# Patient Record
Sex: Female | Born: 1935 | Race: White | Hispanic: No | Marital: Married | Smoking: Former smoker
Health system: Southern US, Community
[De-identification: ages and names within clinical notes are randomized; demographics above are authoritative.]

## PROBLEM LIST (undated history)

## (undated) ENCOUNTER — Ambulatory Visit: Admission: EM | Payer: Medicare PPO

## (undated) DIAGNOSIS — J449 Chronic obstructive pulmonary disease, unspecified: Secondary | ICD-10-CM

## (undated) DIAGNOSIS — K219 Gastro-esophageal reflux disease without esophagitis: Secondary | ICD-10-CM

## (undated) DIAGNOSIS — E785 Hyperlipidemia, unspecified: Secondary | ICD-10-CM

## (undated) DIAGNOSIS — M8580 Other specified disorders of bone density and structure, unspecified site: Secondary | ICD-10-CM

## (undated) DIAGNOSIS — I10 Essential (primary) hypertension: Secondary | ICD-10-CM

## (undated) DIAGNOSIS — K635 Polyp of colon: Secondary | ICD-10-CM

## (undated) HISTORY — PX: EYE SURGERY: SHX253

## (undated) HISTORY — PX: COLONOSCOPY: SHX174

## (undated) HISTORY — DX: Chronic obstructive pulmonary disease, unspecified: J44.9

## (undated) HISTORY — DX: Hyperlipidemia, unspecified: E78.5

## (undated) HISTORY — DX: Gastro-esophageal reflux disease without esophagitis: K21.9

## (undated) HISTORY — DX: Polyp of colon: K63.5

## (undated) HISTORY — PX: DENTAL SURGERY: SHX609

## (undated) HISTORY — DX: Essential (primary) hypertension: I10

## (undated) HISTORY — DX: Other specified disorders of bone density and structure, unspecified site: M85.80

---

## 2005-02-21 ENCOUNTER — Ambulatory Visit: Payer: Self-pay | Admitting: Family Medicine

## 2005-06-06 ENCOUNTER — Ambulatory Visit: Payer: Self-pay | Admitting: Gastroenterology

## 2006-03-20 ENCOUNTER — Encounter: Payer: Self-pay | Admitting: Internal Medicine

## 2006-04-12 ENCOUNTER — Ambulatory Visit: Payer: Self-pay | Admitting: Family Medicine

## 2006-04-14 ENCOUNTER — Encounter: Payer: Self-pay | Admitting: Internal Medicine

## 2006-09-05 ENCOUNTER — Encounter: Payer: Self-pay | Admitting: Internal Medicine

## 2007-04-16 ENCOUNTER — Ambulatory Visit: Payer: Self-pay | Admitting: Family Medicine

## 2008-05-22 ENCOUNTER — Ambulatory Visit: Payer: Self-pay | Admitting: Internal Medicine

## 2008-05-22 DIAGNOSIS — J441 Chronic obstructive pulmonary disease with (acute) exacerbation: Secondary | ICD-10-CM

## 2008-05-22 DIAGNOSIS — Z860100 Personal history of colon polyps, unspecified: Secondary | ICD-10-CM | POA: Insufficient documentation

## 2008-05-22 DIAGNOSIS — I10 Essential (primary) hypertension: Secondary | ICD-10-CM | POA: Insufficient documentation

## 2008-05-22 DIAGNOSIS — R7309 Other abnormal glucose: Secondary | ICD-10-CM

## 2008-05-22 DIAGNOSIS — K219 Gastro-esophageal reflux disease without esophagitis: Secondary | ICD-10-CM

## 2008-05-22 DIAGNOSIS — D649 Anemia, unspecified: Secondary | ICD-10-CM

## 2008-05-22 DIAGNOSIS — J449 Chronic obstructive pulmonary disease, unspecified: Secondary | ICD-10-CM | POA: Insufficient documentation

## 2008-05-22 DIAGNOSIS — E785 Hyperlipidemia, unspecified: Secondary | ICD-10-CM | POA: Insufficient documentation

## 2008-06-10 ENCOUNTER — Encounter: Payer: Self-pay | Admitting: Internal Medicine

## 2008-06-10 ENCOUNTER — Ambulatory Visit: Payer: Self-pay | Admitting: Internal Medicine

## 2008-06-10 LAB — CONVERTED CEMR LAB: Pap Smear: NORMAL

## 2008-06-25 ENCOUNTER — Ambulatory Visit: Payer: Self-pay | Admitting: Internal Medicine

## 2008-06-25 DIAGNOSIS — M949 Disorder of cartilage, unspecified: Secondary | ICD-10-CM

## 2008-06-25 DIAGNOSIS — M899 Disorder of bone, unspecified: Secondary | ICD-10-CM | POA: Insufficient documentation

## 2008-06-25 LAB — CONVERTED CEMR LAB
ALT: 28 units/L (ref 0–35)
AST: 37 units/L (ref 0–37)
Albumin: 3.9 g/dL (ref 3.5–5.2)
Alkaline Phosphatase: 51 units/L (ref 39–117)
Basophils Relative: 0.5 % (ref 0.0–3.0)
Bilirubin Urine: NEGATIVE
CO2: 26 meq/L (ref 19–32)
Calcium: 9.2 mg/dL (ref 8.4–10.5)
Chloride: 108 meq/L (ref 96–112)
Cholesterol: 166 mg/dL (ref 0–200)
GFR calc non Af Amer: 65.62 mL/min (ref >60)
HCT: 36.4 % (ref 36.0–46.0)
Hemoglobin, Urine: NEGATIVE
Hemoglobin: 12.8 g/dL (ref 12.0–15.0)
Ketones, ur: NEGATIVE mg/dL
LDL Cholesterol: 81 mg/dL (ref 0–99)
LDL Goal: 130 mg/dL
Lymphocytes Relative: 24.7 % (ref 12.0–46.0)
Lymphs Abs: 1.3 10*3/uL (ref 0.7–4.0)
Monocytes Relative: 6.7 % (ref 3.0–12.0)
Neutro Abs: 3.4 10*3/uL (ref 1.4–7.7)
RBC: 3.97 M/uL (ref 3.87–5.11)
Total Protein, Urine: NEGATIVE mg/dL
Total Protein: 6.9 g/dL (ref 6.0–8.3)
Triglycerides: 62 mg/dL (ref 0.0–149.0)
Urine Glucose: NEGATIVE mg/dL

## 2008-06-26 ENCOUNTER — Encounter: Payer: Self-pay | Admitting: Internal Medicine

## 2008-10-07 ENCOUNTER — Ambulatory Visit: Payer: Self-pay | Admitting: Internal Medicine

## 2008-10-09 ENCOUNTER — Ambulatory Visit: Payer: Self-pay | Admitting: Internal Medicine

## 2008-10-09 ENCOUNTER — Telehealth: Payer: Self-pay | Admitting: Internal Medicine

## 2008-10-12 ENCOUNTER — Encounter: Payer: Self-pay | Admitting: Internal Medicine

## 2008-10-28 ENCOUNTER — Telehealth: Payer: Self-pay | Admitting: Internal Medicine

## 2008-12-07 ENCOUNTER — Telehealth: Payer: Self-pay | Admitting: Internal Medicine

## 2009-05-18 ENCOUNTER — Telehealth: Payer: Self-pay | Admitting: Internal Medicine

## 2009-06-08 ENCOUNTER — Telehealth: Payer: Self-pay | Admitting: Internal Medicine

## 2009-06-11 ENCOUNTER — Ambulatory Visit: Payer: Self-pay | Admitting: Internal Medicine

## 2009-06-11 ENCOUNTER — Encounter: Payer: Self-pay | Admitting: Internal Medicine

## 2009-08-17 ENCOUNTER — Telehealth: Payer: Self-pay | Admitting: Internal Medicine

## 2009-08-30 ENCOUNTER — Ambulatory Visit: Payer: Self-pay | Admitting: Family Medicine

## 2009-09-01 ENCOUNTER — Ambulatory Visit: Payer: Self-pay | Admitting: Family Medicine

## 2009-09-01 DIAGNOSIS — R5381 Other malaise: Secondary | ICD-10-CM | POA: Insufficient documentation

## 2009-09-01 DIAGNOSIS — R5383 Other fatigue: Secondary | ICD-10-CM

## 2009-09-02 LAB — CONVERTED CEMR LAB
ALT: 25 units/L (ref 0–35)
AST: 27 units/L (ref 0–37)
Albumin: 4.1 g/dL (ref 3.5–5.2)
Basophils Absolute: 0 10*3/uL (ref 0.0–0.1)
Chloride: 106 meq/L (ref 96–112)
Eosinophils Relative: 3 % (ref 0.0–5.0)
GFR calc non Af Amer: 51.34 mL/min (ref >60)
Glucose, Bld: 115 mg/dL — ABNORMAL HIGH (ref 70–99)
HCT: 36.9 % (ref 36.0–46.0)
Hemoglobin: 12.6 g/dL (ref 12.0–15.0)
Lymphs Abs: 1.4 10*3/uL (ref 0.7–4.0)
Monocytes Relative: 7.2 % (ref 3.0–12.0)
Neutro Abs: 4.1 10*3/uL (ref 1.4–7.7)
Potassium: 4.8 meq/L (ref 3.5–5.1)
RDW: 14.8 % — ABNORMAL HIGH (ref 11.5–14.6)
Sodium: 142 meq/L (ref 135–145)
TSH: 2.08 microintl units/mL (ref 0.35–5.50)
Vit D, 25-Hydroxy: 53 ng/mL (ref 30–89)

## 2010-02-10 ENCOUNTER — Encounter (INDEPENDENT_AMBULATORY_CARE_PROVIDER_SITE_OTHER): Payer: Self-pay | Admitting: *Deleted

## 2010-04-12 NOTE — Progress Notes (Signed)
Summary: Referral  Phone Note Call from Patient Call back at Home Phone [phone]   Caller: Patient Summary of Call: pt left message on triage requesting a referral for annual mammogram @ [facility] Initial call taken by: Sydell Axon,  May 18, 2009 4:26 PM  Follow-up for Phone Call        done Follow-up by: Etta Grandchild MD,  May 18, 2009 6:40 PM

## 2010-04-12 NOTE — Miscellaneous (Signed)
Summary: med list update  Clinical Lists Changes  Medications: Removed medication of SPIRIVA HANDIHALER 18 MCG CAPS (TIOTROPIUM BROMIDE MONOHYDRATE)     Prior Medications: EVISTA 60 MG TABS (RALOXIFENE HCL) Take 1 tablet by mouth once a day LUMIGAN 0.03 % SOLN (BIMATOPROST)  CADUET 10-20 MG TABS (AMLODIPINE-ATORVASTATIN) one  day DIOVAN HCT 160-12.5 MG TABS (VALSARTAN-HYDROCHLOROTHIAZIDE) one a day VITAMIN D 1000 UNIT CAPS (CHOLECALCIFEROL) one by mouth bid CALCIUM-CARB 600 600 MG TABS (CALCIUM CARBONATE) One by mouth TID BENZONATATE 100 MG CAPS (BENZONATATE) as needed SPIRIVA HANDIHALER 18 MCG CAPS (TIOTROPIUM BROMIDE MONOHYDRATE) inhale contents of one capsule once daily Current Allergies: ! CODEINE

## 2010-04-12 NOTE — Progress Notes (Signed)
  Phone Note Refill Request Message from:  Fax from Pharmacy on June 08, 2009 8:39 AM  Refills Requested: Medication #1:  EVISTA 60 MG TABS Take 1 tablet by mouth once a day   Supply Requested: 1 month Initial call taken by: Rock Nephew CMA,  June 08, 2009 8:39 AM    Prescriptions: EVISTA 60 MG TABS (RALOXIFENE HCL) Take 1 tablet by mouth once a day  #30 x 6   Entered by:   Rock Nephew CMA   Authorized by:   Etta Grandchild MD   Signed by:   Rock Nephew CMA on 06/08/2009   Method used:   Electronically to        [facility]* (retail)       [address]       [place], [place]  [address]       Ph: [phone]       Fax: [phone]   RxID:   [id number]   Appended Document:    PAP Screening:    Last PAP smear:  06/10/2008  Mammogram Screening:    Last Mammogram:  06/11/2009  Mammogram Results:    Date of Exam:  06/11/2009    Results:  Normal Bilateral  Osteoporosis Risk Assessment:  Risk Factors for Fracture or Low Bone Density:   Race (White or Asian):     yes   Hx of Fractures:       no   FH of Osteoporosis:     no   Hx of falls:       no   Physically inactive:     no   Smoking status:       quit   High alcohol use:     no   Low calcium/Vit. D intake:   no   Corticosteroid use:     no   Heparin use:       no   Thyroid disease:     no   Rheumatoid Arthritis:     no  Immunization & Chemoprophylaxis:    Hepatitis B vaccine #1: Historical  (12/01/2005)    Hepatitis B vaccine #2: Historical  (01/01/2006)    Hepatitis B vaccine #3: Historical  (07/06/2006)    Hepatitis A vaccine #1: Historical  (06/26/1995)    Hepatitis A vaccine #2: Historical  (12/26/1995)    Tetanus vaccine: Historical  (12/01/2005)    Influenza vaccine: given  (03/14/2007)    Pneumovax: Historical  (12/01/2005)

## 2010-04-12 NOTE — Miscellaneous (Signed)
Summary: med list update- spiriva  Clinical Lists Changes  Medications: Added new medication of SPIRIVA HANDIHALER 18 MCG CAPS (TIOTROPIUM BROMIDE MONOHYDRATE) inhale contents of one capsule once daily     Prior Medications: EVISTA 60 MG TABS (RALOXIFENE HCL) Take 1 tablet by mouth once a day LUMIGAN 0.03 % SOLN (BIMATOPROST)  SPIRIVA HANDIHALER 18 MCG CAPS (TIOTROPIUM BROMIDE MONOHYDRATE)  CADUET 10-20 MG TABS (AMLODIPINE-ATORVASTATIN) one  day DIOVAN HCT 160-12.5 MG TABS (VALSARTAN-HYDROCHLOROTHIAZIDE) one a day VITAMIN D 1000 UNIT CAPS (CHOLECALCIFEROL) one by mouth bid CALCIUM-CARB 600 600 MG TABS (CALCIUM CARBONATE) One by mouth TID BENZONATATE 100 MG CAPS (BENZONATATE) as needed SPIRIVA HANDIHALER 18 MCG CAPS (TIOTROPIUM BROMIDE MONOHYDRATE) inhale contents of one capsule once daily Current Allergies: ! CODEINE

## 2010-04-12 NOTE — Assessment & Plan Note (Signed)
Summary: NEW PT TO EST/CLE   Vital Signs:  Patient profile:   75 year old female Height:      63 inches Weight:      171.6 pounds BMI:     30.51 Temp:     98.3 degrees F oral Pulse rate:   76 / minute Pulse rhythm:   regular BP sitting:   120 / 70  (left arm) Cuff size:   regular  Vitals Entered By: Benny Lennert CMA Duncan Dull) (August 30, 2009 9:08 AM)  History of Present Illness: Chief complaint new patient from [facility]  HTN: stable on current meds, no probs  HYPERLIPIDEMIA, on caduet, carries this diagnosis with no recent labs  COPD: PFT's reviewed with her - mild emphysema  GERD, stable       Allergies: 1)  ! Codeine  Past History:  Past medical, surgical, family and social histories (including risk factors) reviewed, and no changes noted (except as noted below).  Past Medical History: Reviewed history from 06/25/2008 and no changes required. Colonic polyps, hx of COPD GERD Hyperlipidemia Hypertension Osteopenia  Past Surgical History: Reviewed history from 05/22/2008 and no changes required. Denies surgical history  Family History: Reviewed history from 05/22/2008 and no changes required. Family History of Alcoholism/Addiction Family History of Arthritis Family History Diabetes 1st degree relative Family History High cholesterol Family History Hypertension Family History of Stroke F 1st degree relative <60  Social History: Reviewed history from 05/22/2008 and no changes required. Retired Married Alcohol use-yes Drug use-no Regular exercise-yes  Review of Systems       REVIEW OF SYSTEMS GEN: some nasal congestion now, no fever, chills, sweats. CV: No chest pain or SOB GI: No noted N or V Otherwise, pertinent positives and negatives are noted in the HPI.   Physical Exam  General:  GEN: WDWN, NAD, Non-toxic, A & O x 3 HEENT: Atraumatic, Normocephalic. Neck supple. No masses, No LAD. Ears and Nose: No external deformity. CV: RRR, No  M/G/R. No JVD. No thrill. No extra heart sounds. PULM: CTA B, no wheezes, crackles, rhonchi. No retractions. No resp. distress. No accessory muscle use. EXTR: No c/c/e NEURO: Normal gait.  PSYCH: Normally interactive. Conversant. Not depressed or anxious appearing.  Calm demeanor.     Impression & Recommendations:  Problem # 1:  HYPERTENSION (ICD-401.9) Assessment Unchanged  Her updated medication list for this problem includes:    Caduet 10-20 Mg Tabs (Amlodipine-atorvastatin) ..... One  day    Diovan Hct 160-12.5 Mg Tabs (Valsartan-hydrochlorothiazide) ..... One a day  BP today: 120/70 Prior BP: 120/72 (10/07/2008)  Prior 10 Yr Risk Heart Disease: 5 % (10/07/2008)  Labs Reviewed: K+: 4.5 (06/25/2008) Creat: : 0.9 (06/25/2008)   Chol: 166 (06/25/2008)   HDL: 72.40 (06/25/2008)   LDL: 81 (06/25/2008)   TG: 62.0 (06/25/2008)  Orders: Prescription Created Electronically [id number])  Problem # 2:  HYPERLIPIDEMIA (ICD-272.4) Assessment: Unchanged  Her updated medication list for this problem includes:    Caduet 10-20 Mg Tabs (Amlodipine-atorvastatin) ..... One  day  Labs Reviewed: SGOT: 37 (06/25/2008)   SGPT: 28 (06/25/2008)  Lipid Goals: Chol Goal: 200 (06/25/2008)   HDL Goal: 40 (06/25/2008)   LDL Goal: 130 (06/25/2008)   TG Goal: 150 (06/25/2008)  Prior 10 Yr Risk Heart Disease: 5 % (10/07/2008)   HDL:72.40 (06/25/2008)  LDL:81 (06/25/2008)  Chol:166 (06/25/2008)  Trig:62.0 (06/25/2008)  Problem # 3:  COPD (ICD-496) Assessment: Unchanged  The following medications were removed from the medication list:  Singulair 10 Mg Tabs (Montelukast sodium) Her updated medication list for this problem includes:    Spiriva Handihaler 18 Mcg Caps (Tiotropium bromide monohydrate)  Problem # 4:  GERD (ICD-530.81)  Complete Medication List: 1)  Evista 60 Mg Tabs (Raloxifene hcl) .... Take 1 tablet by mouth once a day 2)  Lumigan 0.03 % Soln (Bimatoprost) 3)  Spiriva Handihaler 18  Mcg Caps (Tiotropium bromide monohydrate) 4)  Caduet 10-20 Mg Tabs (Amlodipine-atorvastatin) .... One  day 5)  Diovan Hct 160-12.5 Mg Tabs (Valsartan-hydrochlorothiazide) .... One a day 6)  Vitamin D 1000 Unit Caps (Cholecalciferol) .... One by mouth bid 7)  Calcium-carb 600 600 Mg Tabs (Calcium carbonate) .... One by mouth tid 8)  Benzonatate 100 Mg Caps (Benzonatate) .... As needed  Patient Instructions: 1)  Please make a future lab appointment: Stop at the front desk on your way out to set it up. You can come in that day directly and will not have to see the doctor. 2)  future fasting lipid panel 3)  272.4 4)  BMP: v77.1 5)  Hepatic Function Panel: v58.69 6)  CBC with diff: 780.79 7)  TSH: 780.79  8)  Vit D: 780.79 Prescriptions: EVISTA 60 MG TABS (RALOXIFENE HCL) Take 1 tablet by mouth once a day  #30 x 11   Entered and Authorized by:   Hannah Beat MD   Signed by:   Hannah Beat MD on 08/30/2009   Method used:   Electronically to        [facility]* (retail)       [address]       [place], [place]  [address]       Ph: [phone]       Fax: [phone]   RxID:   [id number] DIOVAN HCT 160-12.5 MG TABS (VALSARTAN-HYDROCHLOROTHIAZIDE) one a day  #30 x 11   Entered and Authorized by:   Hannah Beat MD   Signed by:   Hannah Beat MD on 08/30/2009   Method used:   Electronically to        [facility]* (retail)       [address]       [place], [place]  [address]       Ph: [phone]       Fax: [phone]   RxID:   [id number]   Current Allergies (reviewed today): ! CODEINE

## 2010-04-12 NOTE — Progress Notes (Signed)
  Phone Note Refill Request Message from:  Fax from Pharmacy on August 17, 2009 10:40 AM  Refills Requested: Medication #1:  DIOVAN HCT 160-12.5 MG TABS one a day Initial call taken by: Rock Nephew CMA,  August 17, 2009 10:40 AM    Prescriptions: DIOVAN HCT 160-12.5 MG TABS (VALSARTAN-HYDROCHLOROTHIAZIDE) one a day  #30 x 4   Entered by:   Rock Nephew CMA   Authorized by:   Etta Grandchild MD   Signed by:   Rock Nephew CMA on 08/17/2009   Method used:   Electronically to        [facility]* (retail)       [address]       [place], [place]  [address]       Ph: [phone]       Fax: [phone]   RxID:   [id number]

## 2010-05-24 NOTE — Letter (Signed)
Summary: Rebecca Hamilton NEW PATIENT QUESTIONAIRE  [place] NEW PATIENT QUESTIONAIRE   Imported By: Carin Primrose 05/16/2010 17:09:24  _____________________________________________________________________  External Attachment:    Type:   Image     Comment:   External Document

## 2010-06-14 ENCOUNTER — Ambulatory Visit: Payer: Self-pay | Admitting: Family Medicine

## 2010-09-05 ENCOUNTER — Other Ambulatory Visit: Payer: Self-pay | Admitting: *Deleted

## 2010-09-05 MED ORDER — VALSARTAN-HYDROCHLOROTHIAZIDE 160-12.5 MG PO TABS: 1.0000 | ORAL_TABLET | Freq: Every day | ORAL | Status: DC

## 2010-09-05 NOTE — Telephone Encounter (Signed)
Add to medication list checked in EMR to verify and it was on their medication list

## 2010-09-06 MED ORDER — RALOXIFENE HCL 60 MG PO TABS: 60.0000 mg | ORAL_TABLET | Freq: Every day | ORAL | Status: DC

## 2010-09-06 NOTE — Telephone Encounter (Signed)
Patient already had physical appointment for the end of july

## 2010-09-06 NOTE — Telephone Encounter (Signed)
Ok to refill #30, 1 refill  Arrange annual CPX, 30 mins, in next 1-2 months

## 2010-10-05 ENCOUNTER — Other Ambulatory Visit (INDEPENDENT_AMBULATORY_CARE_PROVIDER_SITE_OTHER): Payer: Medicare Other

## 2010-10-05 DIAGNOSIS — E559 Vitamin D deficiency, unspecified: Secondary | ICD-10-CM

## 2010-10-05 DIAGNOSIS — R5383 Other fatigue: Secondary | ICD-10-CM

## 2010-10-05 DIAGNOSIS — Z13220 Encounter for screening for lipoid disorders: Secondary | ICD-10-CM

## 2010-10-05 DIAGNOSIS — R5381 Other malaise: Secondary | ICD-10-CM

## 2010-10-05 DIAGNOSIS — Z79899 Other long term (current) drug therapy: Secondary | ICD-10-CM

## 2010-10-05 LAB — BASIC METABOLIC PANEL
BUN: 14 mg/dL (ref 6–23)
Calcium: 9.1 mg/dL (ref 8.4–10.5)
GFR: 68.71 mL/min (ref >60.00)
Glucose, Bld: 99 mg/dL (ref 70–99)

## 2010-10-05 LAB — CBC WITH DIFFERENTIAL/PLATELET
Basophils Relative: 0.8 % (ref 0.0–3.0)
Eosinophils Relative: 5.9 % — ABNORMAL HIGH (ref 0.0–5.0)
HCT: 38.1 % (ref 36.0–46.0)
Lymphs Abs: 1.4 10*3/uL (ref 0.7–4.0)
Monocytes Relative: 7.3 % (ref 3.0–12.0)
Neutrophils Relative %: 61.2 % (ref 43.0–77.0)
Platelets: 216 10*3/uL (ref 150.0–400.0)
RBC: 4.06 Mil/uL (ref 3.87–5.11)
WBC: 5.6 10*3/uL (ref 4.5–10.5)

## 2010-10-05 LAB — LIPID PANEL
HDL: 69.4 mg/dL (ref >39.00)
Total CHOL/HDL Ratio: 2

## 2010-10-05 LAB — HEPATIC FUNCTION PANEL
ALT: 26 U/L (ref 0–35)
AST: 29 U/L (ref 0–37)
Bilirubin, Direct: 0 mg/dL (ref 0.0–0.3)
Total Bilirubin: 0.5 mg/dL (ref 0.3–1.2)
Total Protein: 6.8 g/dL (ref 6.0–8.3)

## 2010-10-07 ENCOUNTER — Encounter: Payer: Self-pay | Admitting: Family Medicine

## 2010-10-10 ENCOUNTER — Ambulatory Visit (INDEPENDENT_AMBULATORY_CARE_PROVIDER_SITE_OTHER): Payer: Medicare Other | Admitting: Family Medicine

## 2010-10-10 ENCOUNTER — Encounter: Payer: Self-pay | Admitting: Family Medicine

## 2010-10-10 ENCOUNTER — Other Ambulatory Visit (HOSPITAL_COMMUNITY)
Admission: RE | Admit: 2010-10-10 | Discharge: 2010-10-10 | Disposition: A | Discharge status: Home / Self Care | Payer: Medicare Other | Source: Ambulatory Visit | Attending: Family Medicine | Admitting: Family Medicine

## 2010-10-10 VITALS — BP 120/72 | HR 77 | Temp 98.1°F | Ht 62.0 in | Wt 167.4 lb

## 2010-10-10 DIAGNOSIS — Z0000 Encounter for general adult medical examination without abnormal findings: Secondary | ICD-10-CM

## 2010-10-10 DIAGNOSIS — Z87898 Personal history of other specified conditions: Secondary | ICD-10-CM

## 2010-10-10 DIAGNOSIS — Z01419 Encounter for gynecological examination (general) (routine) without abnormal findings: Secondary | ICD-10-CM

## 2010-10-10 DIAGNOSIS — E785 Hyperlipidemia, unspecified: Secondary | ICD-10-CM

## 2010-10-10 DIAGNOSIS — Z0100 Encounter for examination of eyes and vision without abnormal findings: Secondary | ICD-10-CM

## 2010-10-10 DIAGNOSIS — I10 Essential (primary) hypertension: Secondary | ICD-10-CM

## 2010-10-10 DIAGNOSIS — Z124 Encounter for screening for malignant neoplasm of cervix: Secondary | ICD-10-CM

## 2010-10-10 DIAGNOSIS — M899 Disorder of bone, unspecified: Secondary | ICD-10-CM

## 2010-10-10 NOTE — Progress Notes (Signed)
Rebecca Hamilton, a 75 y.o. female presents today in the office for the following:    Medicare CPX, f/u medical issues, as well as breast exam and pap smear:  Pap: h/o abnormal pap smears in the past 20 years ago had some abnormal pap smears Last was 5 years ago  Paps, every 3 years needed  HTN: Tolerating all medications without side effects Stable and at goal No CP, no sob. No HA.  BP Readings from Last 3 Encounters:  10/10/10 120/72  08/30/09 120/70  10/07/08 120/72    Basic Metabolic Panel:    Component Value Date/Time   NA 142 10/05/2010 0930   K 4.8 10/05/2010 0930   CL 108 10/05/2010 0930   CO2 27 10/05/2010 0930   BUN 14 10/05/2010 0930   CREATININE 0.9 10/05/2010 0930   GLUCOSE 99 10/05/2010 0930   CALCIUM 9.1 10/05/2010 0930   Lipids: Doing well, stable. Tolerating meds fine with no SE. Panel reviewed with patient.  Lipids:    Component Value Date/Time   CHOL 158 10/05/2010 0930   TRIG 92.0 10/05/2010 0930   HDL 69.40 10/05/2010 0930   VLDL 18.4 10/05/2010 0930   CHOLHDL 2 10/05/2010 0930    Lab Results  Component Value Date   ALT 26 10/05/2010   AST 29 10/05/2010   ALKPHOS 50 10/05/2010   BILITOT 0.5 10/05/2010     Health Maintenance Summary Reviewed and updated, unless pt declines services.  Tobacco History Reviewed. Non-smoker Alcohol: No concerns, no excessive use Exercise Habits: Some activity, rec at least 30 mins 5 times a week - working out now Menses regular: n/a Lumps or breast concerns: no   Health Maintenance  Topic Date Due  . Influenza Vaccine  12/12/2010  . Tetanus/tdap  12/02/2015  . Colonoscopy  05/23/2018  . Pneumococcal Polysaccharide Vaccine Age 75 And Over  Completed  . Zostavax  Completed    Labs reviewed with the patient.   Lipids:    Component Value Date/Time   CHOL 158 10/05/2010 0930   TRIG 92.0 10/05/2010 0930   HDL 69.40 10/05/2010 0930   VLDL 18.4 10/05/2010 0930   CHOLHDL 2 10/05/2010 0930    CBC:    Component Value  Date/Time   WBC 5.6 10/05/2010 0930   HGB 12.8 10/05/2010 0930   HCT 38.1 10/05/2010 0930   PLT 216.0 10/05/2010 0930   MCV 93.8 10/05/2010 0930   NEUTROABS 3.4 10/05/2010 0930   LYMPHSABS 1.4 10/05/2010 0930   MONOABS 0.4 10/05/2010 0930   EOSABS 0.3 10/05/2010 0930   BASOSABS 0.0 10/05/2010 0930    Basic Metabolic Panel:    Component Value Date/Time   NA 142 10/05/2010 0930   K 4.8 10/05/2010 0930   CL 108 10/05/2010 0930   CO2 27 10/05/2010 0930   BUN 14 10/05/2010 0930   CREATININE 0.9 10/05/2010 0930   GLUCOSE 99 10/05/2010 0930   CALCIUM 9.1 10/05/2010 0930    Lab Results  Component Value Date   ALT 26 10/05/2010   AST 29 10/05/2010   ALKPHOS 50 10/05/2010   BILITOT 0.5 10/05/2010   Osteopenia, has been on some form of female hormones then bisphosphonates since the 1980's.  I have personally reviewed the Medicare Annual Wellness questionnaire and have noted 1. The patient's medical and social history 2. Their use of alcohol, tobacco or illicit drugs 3. Their current medications and supplements 4. The patient's functional ability including ADL's, fall risks, home safety risks and hearing or visual  impairment. 5. Diet and physical activities 6. Evidence for depression or mood disorders  The patients weight, height, BMI and visual acuity have been recorded in the chart I have made referrals, counseling and provided education to the patient based review of the above and I have provided the pt with a written personalized care plan for preventive services.  I have provided the patient with a copy of your personalized plan for preventive services. Instructed to take the time to review along with their updated medication list.    Patient Active Problem List  Diagnoses  . HYPERLIPIDEMIA  . UNSPECIFIED ANEMIA  . HYPERTENSION  . COPD  . GERD  . OSTEOPENIA  . FATIGUE  . COLONIC POLYPS, HX OF  . Routine general medical examination at a health care facility   Past Medical  History  Diagnosis Date  . COPD (chronic obstructive pulmonary disease)   . GERD (gastroesophageal reflux disease)   . Osteopenia   . HTN (hypertension)   . Hyperlipidemia   . Colon polyps    No past surgical history on file. History  Substance Use Topics  . Smoking status: Never Smoker   . Smokeless tobacco: Not on file  . Alcohol Use: Yes   No family history on file. Allergies  Allergen Reactions  . Codeine    Current Outpatient Prescriptions on File Prior to Visit  Medication Sig Dispense Refill  . amlodipine-atorvastatin (CADUET) 10-20 MG per tablet Take 1 tablet by mouth daily.        . benzonatate (TESSALON) 100 MG capsule Take 100 mg by mouth as needed.        . bimatoprost (LUMIGAN) 0.03 % ophthalmic solution 1 drop at bedtime.        . calcium carbonate (OS-CAL) 600 MG TABS Take 600 mg by mouth 3 (three) times daily with meals.        . cholecalciferol (VITAMIN D) 1000 UNITS tablet Take 1,000 Units by mouth 3 (three) times daily.        . raloxifene (EVISTA) 60 MG tablet Take 1 tablet (60 mg total) by mouth daily.  30 tablet  1  . tiotropium (SPIRIVA HANDIHALER) 18 MCG inhalation capsule Place 18 mcg into inhaler and inhale daily.        . valsartan-hydrochlorothiazide (DIOVAN-HCT) 160-12.5 MG per tablet Take 1 tablet by mouth daily.  30 tablet  0   General: Denies fever, chills, sweats. No significant weight loss. Eyes: Denies blurring,significant itching ENT: Denies earache, sore throat, and hoarseness.  Cardiovascular: Denies chest pains  Respiratory: Denies cough, dyspnea at rest,wheeezing Breast: no concerns about lumps - some longstanding fibrocystic changes GI: Denies nausea, vomiting, diarrhea, BRBPR GU: Denies dysuria, discharge Musculoskeletal: Denies back pain, joint pain significantly Derm: Denies rash Neuro: Denies  Any significant problems Psych: Denies depression Endocrine: Denies symptoms, increased urinary outflow Heme: Denies enlarged lymph  nodes Allergy: No hayfever   Physical Exam  Blood pressure 120/72, pulse 77, temperature 98.1 F (36.7 C), temperature source Oral, height 5\' 2"  (1.575 m), weight 167 lb 6.4 oz (75.932 kg), SpO2 98.00%.  GEN: well developed, well nourished, no acute distress Eyes: conjunctiva and lids normal, PERRLA, EOMI ENT: TM clear, nares clear, oral exam WNL Neck: supple, no lymphadenopathy, no thyromegaly, no JVD Pulm: clear to auscultation and percussion, respiratory effort normal CV: regular rate and rhythm, S1-S2, no murmur, rub or gallop, no bruits, peripheral pulses normal and symmetric, no cyanosis, clubbing, edema  Chest: no scars, masses, no gynecomastia  BREAST: no lumps, no axillary LAD, no nipple discharge, some fibrocystic changes GI: soft, non-tender; no hepatosplenomegaly, masses; active bowel sounds all quadrants GU: Normal external female genitalia. Cervix appears intact without lesions or irritation. Vaginal canal normal without ulceration or lesion. Cervix NT to exam.  Lymph: no cervical, axillary or inguinal adenopathy MSK: gait normal, muscle tone and strength WNL, no joint swelling, effusions, discoloration, crepitus  SKIN: clear, good turgor, color WNL, no rashes, lesions, or ulcerations Neuro: normal mental status, normal strength, sensation, and motion Psych: alert; oriented to person, place and time, normally interactive and not anxious or depressed in appearance.  Assessment and Plan: 1.  Routine general medical examination at a health care facility The patient's preventative maintenance and recommended screening tests for an annual wellness exam were reviewed in full today. Brought up to date unless services declined.  Counselled on the importance of diet, exercise, and its role in overall health and mortality. The patient's FH and SH was reviewed, including their home life, tobacco status, and drug and alcohol status.  Medicare wellness completed  History of  abnormal Pap smear Breast exam and pap done today  HYPERLIPIDEMIA Stable, continue caduet  HYPERTENSION Stable, cont caduet and diovan-hct  OSTEOPENIA Check DEXA scan. If normal, d/c bisphosphonate and ca and vit D only

## 2010-10-10 NOTE — Patient Instructions (Signed)
REFERRAL: GO THE THE FRONT ROOM AT THE ENTRANCE OF OUR CLINIC, NEAR CHECK IN. ASK FOR MARION. SHE WILL HELP YOU SET UP YOUR REFERRAL. DATE: TIME:  

## 2010-10-11 ENCOUNTER — Encounter: Payer: Self-pay | Admitting: Family Medicine

## 2010-10-11 DIAGNOSIS — Z87898 Personal history of other specified conditions: Secondary | ICD-10-CM | POA: Insufficient documentation

## 2010-10-11 DIAGNOSIS — Z0000 Encounter for general adult medical examination without abnormal findings: Secondary | ICD-10-CM | POA: Insufficient documentation

## 2010-10-11 NOTE — Assessment & Plan Note (Signed)
The patient's preventative maintenance and recommended screening tests for an annual wellness exam were reviewed in full today. Brought up to date unless services declined.  Counselled on the importance of diet, exercise, and its role in overall health and mortality. The patient's FH and SH was reviewed, including their home life, tobacco status, and drug and alcohol status.  Medicare wellness completed

## 2010-10-11 NOTE — Assessment & Plan Note (Signed)
Stable, cont caduet and diovan-hct

## 2010-10-11 NOTE — Assessment & Plan Note (Signed)
Stable, continue caduet

## 2010-10-11 NOTE — Assessment & Plan Note (Signed)
Breast exam and pap done today

## 2010-10-11 NOTE — Assessment & Plan Note (Signed)
Check DEXA scan. If normal, d/c bisphosphonate and ca and vit D only

## 2010-10-14 ENCOUNTER — Encounter: Payer: Self-pay | Admitting: *Deleted

## 2010-10-19 ENCOUNTER — Other Ambulatory Visit: Payer: Self-pay | Admitting: *Deleted

## 2010-10-19 MED ORDER — VALSARTAN-HYDROCHLOROTHIAZIDE 160-12.5 MG PO TABS: 1.0000 | ORAL_TABLET | Freq: Every day | ORAL | Status: DC

## 2010-10-21 ENCOUNTER — Other Ambulatory Visit: Payer: Self-pay | Admitting: *Deleted

## 2010-10-21 MED ORDER — RALOXIFENE HCL 60 MG PO TABS: 60.0000 mg | ORAL_TABLET | Freq: Every day | ORAL | Status: DC

## 2010-11-08 ENCOUNTER — Other Ambulatory Visit: Payer: Self-pay | Admitting: *Deleted

## 2010-11-08 ENCOUNTER — Ambulatory Visit: Payer: Self-pay | Admitting: Family Medicine

## 2010-11-08 MED ORDER — AMLODIPINE-ATORVASTATIN 10-20 MG PO TABS: 1.0000 | ORAL_TABLET | Freq: Every day | ORAL | Status: DC

## 2010-11-09 ENCOUNTER — Encounter: Payer: Self-pay | Admitting: Family Medicine

## 2011-02-24 ENCOUNTER — Other Ambulatory Visit: Payer: Self-pay | Admitting: *Deleted

## 2011-02-24 MED ORDER — VALSARTAN-HYDROCHLOROTHIAZIDE 160-12.5 MG PO TABS: 1.0000 | ORAL_TABLET | Freq: Every day | ORAL | Status: DC

## 2011-03-13 ENCOUNTER — Other Ambulatory Visit: Payer: Self-pay | Admitting: *Deleted

## 2011-03-13 MED ORDER — TIOTROPIUM BROMIDE MONOHYDRATE 18 MCG IN CAPS: 18.0000 ug | ORAL_CAPSULE | Freq: Every day | RESPIRATORY_TRACT | Status: DC

## 2011-07-07 ENCOUNTER — Ambulatory Visit: Payer: Self-pay | Admitting: Family Medicine

## 2011-07-07 ENCOUNTER — Encounter: Payer: Self-pay | Admitting: Family Medicine

## 2011-07-10 ENCOUNTER — Encounter: Payer: Self-pay | Admitting: *Deleted

## 2011-09-11 ENCOUNTER — Other Ambulatory Visit: Payer: Medicare Other

## 2011-09-18 ENCOUNTER — Encounter: Payer: Medicare Other | Admitting: Family Medicine

## 2011-10-27 ENCOUNTER — Other Ambulatory Visit: Payer: Self-pay | Admitting: *Deleted

## 2011-10-27 ENCOUNTER — Other Ambulatory Visit: Payer: Self-pay | Admitting: Family Medicine

## 2011-10-27 DIAGNOSIS — R5381 Other malaise: Secondary | ICD-10-CM

## 2011-10-27 DIAGNOSIS — D649 Anemia, unspecified: Secondary | ICD-10-CM

## 2011-10-27 DIAGNOSIS — E785 Hyperlipidemia, unspecified: Secondary | ICD-10-CM

## 2011-10-27 MED ORDER — VALSARTAN-HYDROCHLOROTHIAZIDE 160-12.5 MG PO TABS: 1.0000 | ORAL_TABLET | Freq: Every day | ORAL | Status: DC

## 2011-11-01 ENCOUNTER — Other Ambulatory Visit (INDEPENDENT_AMBULATORY_CARE_PROVIDER_SITE_OTHER): Payer: Medicare Other

## 2011-11-01 DIAGNOSIS — E785 Hyperlipidemia, unspecified: Secondary | ICD-10-CM

## 2011-11-01 DIAGNOSIS — R5381 Other malaise: Secondary | ICD-10-CM

## 2011-11-01 DIAGNOSIS — D649 Anemia, unspecified: Secondary | ICD-10-CM

## 2011-11-01 LAB — CBC WITH DIFFERENTIAL/PLATELET
Basophils Relative: 0.7 % (ref 0.0–3.0)
Eosinophils Relative: 3.5 % (ref 0.0–5.0)
HCT: 43.7 % (ref 36.0–46.0)
Hemoglobin: 14.3 g/dL (ref 12.0–15.0)
Lymphocytes Relative: 25.4 % (ref 12.0–46.0)
Lymphs Abs: 1.5 10*3/uL (ref 0.7–4.0)
Monocytes Relative: 8.8 % (ref 3.0–12.0)
Neutro Abs: 3.7 10*3/uL (ref 1.4–7.7)
RBC: 4.58 Mil/uL (ref 3.87–5.11)
RDW: 13.9 % (ref 11.5–14.6)
WBC: 6 10*3/uL (ref 4.5–10.5)

## 2011-11-01 LAB — BASIC METABOLIC PANEL
CO2: 29 mEq/L (ref 19–32)
Calcium: 10.3 mg/dL (ref 8.4–10.5)
Creatinine, Ser: 0.8 mg/dL (ref 0.4–1.2)
GFR: 74.47 mL/min (ref >60.00)
Sodium: 139 mEq/L (ref 135–145)

## 2011-11-01 LAB — LIPID PANEL
LDL Cholesterol: 77 mg/dL (ref 0–99)
Total CHOL/HDL Ratio: 2
Triglycerides: 75 mg/dL (ref 0.0–149.0)

## 2011-11-01 LAB — HEPATIC FUNCTION PANEL
ALT: 28 U/L (ref 0–35)
AST: 28 U/L (ref 0–37)
Albumin: 4.1 g/dL (ref 3.5–5.2)
Alkaline Phosphatase: 53 U/L (ref 39–117)

## 2011-11-08 ENCOUNTER — Ambulatory Visit (INDEPENDENT_AMBULATORY_CARE_PROVIDER_SITE_OTHER): Payer: Medicare Other | Admitting: Family Medicine

## 2011-11-08 ENCOUNTER — Encounter: Payer: Self-pay | Admitting: Family Medicine

## 2011-11-08 VITALS — BP 120/70 | HR 72 | Temp 97.8°F | Ht 62.5 in | Wt 166.5 lb

## 2011-11-08 DIAGNOSIS — Z0000 Encounter for general adult medical examination without abnormal findings: Secondary | ICD-10-CM

## 2011-11-08 NOTE — Progress Notes (Signed)
Nature conservation officer at [facility] [address] [place] [address] Phone: [phone] Fax: [phone]  Date:  11/08/2011   Name:  Rebecca Hamilton   DOB:  [birth year]   MRN:  [id number] Gender: female Age: 76 y.o.  PCP:  Hannah Beat, MD    Chief Complaint: Annual Exam   History of Present Illness:  Rebecca Hamilton is a 76 y.o. pleasant patient who presents with the following:  Here for medicare wellness exam: generally, she is feeling well.  Health Maintenance Summary Reviewed and updated, unless pt declines services.  Tobacco History Reviewed. Non-smoker Alcohol: No concerns, no excessive use Exercise Habits: Some activity, rec at least 30 mins 5 times a week Lumps or breast concerns: no  Health Maintenance  Topic Date Due  . Influenza Vaccine  12/12/2011  . Mammogram  11/07/2012  . Tetanus/tdap  12/02/2015  . Colonoscopy  05/23/2018  . Pneumococcal Polysaccharide Vaccine Age 59 And Over  Completed  . Zostavax  Completed    Labs reviewed with the patient.  Results for orders placed in visit on 11/01/11  LIPID PANEL      Component Value Range   Cholesterol 170  0 - 200 mg/dL   Triglycerides 84.1  0.0 - 149.0 mg/dL   HDL 32.44  >01.02 mg/dL   VLDL 72.5  0.0 - 36.6 mg/dL   LDL Cholesterol 77  0 - 99 mg/dL   Total CHOL/HDL Ratio 2    CBC WITH DIFFERENTIAL      Component Value Range   WBC 6.0  4.5 - 10.5 K/uL   RBC 4.58  3.87 - 5.11 Mil/uL   Hemoglobin 14.3  12.0 - 15.0 g/dL   HCT 44.0  34.7 - 42.5 %   MCV 95.3  78.0 - 100.0 fl   MCHC 32.7  30.0 - 36.0 g/dL   RDW 95.6  38.7 - 56.4 %   Platelets 253.0  150.0 - 400.0 K/uL   Neutrophils Relative 61.6  43.0 - 77.0 %   Lymphocytes Relative 25.4  12.0 - 46.0 %   Monocytes Relative 8.8  3.0 - 12.0 %   Eosinophils Relative 3.5  0.0 - 5.0 %   Basophils Relative 0.7  0.0 - 3.0 %   Neutro Abs 3.7  1.4 - 7.7 K/uL   Lymphs Abs 1.5  0.7 - 4.0 K/uL   Monocytes Absolute 0.5  0.1 - 1.0 K/uL   Eosinophils Absolute  0.2  0.0 - 0.7 K/uL   Basophils Absolute 0.0  0.0 - 0.1 K/uL  HEPATIC FUNCTION PANEL      Component Value Range   Total Bilirubin 0.7  0.3 - 1.2 mg/dL   Bilirubin, Direct 0.1  0.0 - 0.3 mg/dL   Alkaline Phosphatase 53  39 - 117 U/L   AST 28  0 - 37 U/L   ALT 28  0 - 35 U/L   Total Protein 7.4  6.0 - 8.3 g/dL   Albumin 4.1  3.5 - 5.2 g/dL  BASIC METABOLIC PANEL      Component Value Range   Sodium 139  135 - 145 mEq/L   Potassium 5.0  3.5 - 5.1 mEq/L   Chloride 104  96 - 112 mEq/L   CO2 29  19 - 32 mEq/L   Glucose, Bld 104 (*) 70 - 99 mg/dL   BUN 17  6 - 23 mg/dL   Creatinine, Ser 0.8  0.4 - 1.2 mg/dL   Calcium 33.2  8.4 - 95.1  mg/dL   GFR 16.10  >96.04 mL/min  TSH      Component Value Range   TSH 2.24  0.35 - 5.50 uIU/mL     Patient Active Problem List  Diagnosis  . HYPERLIPIDEMIA  . UNSPECIFIED ANEMIA  . HYPERTENSION  . COPD  . GERD  . OSTEOPENIA  . FATIGUE  . COLONIC POLYPS, HX OF  . Routine general medical examination at a health care facility  . History of abnormal Pap smear    Past Medical History  Diagnosis Date  . COPD (chronic obstructive pulmonary disease)   . GERD (gastroesophageal reflux disease)   . Osteopenia   . HTN (hypertension)   . Hyperlipidemia   . Colon polyps     No past surgical history on file.  History  Substance Use Topics  . Smoking status: Former Games developer  . Smokeless tobacco: Never Used   Comment: quit 1988  . Alcohol Use: Yes     daily    No family history on file.  Allergies  Allergen Reactions  . Codeine     Medication list has been reviewed and updated.  Current Outpatient Prescriptions on File Prior to Visit  Medication Sig Dispense Refill  . aspirin 81 MG tablet Take 81 mg by mouth daily.        . bimatoprost (LUMIGAN) 0.03 % ophthalmic solution Place 1 drop into both eyes at bedtime.       . calcium carbonate (OS-CAL) 600 MG TABS Take 600 mg by mouth 2 (two) times daily with a meal.       . Thiamine HCl  (VITAMIN B-1) 50 MG tablet Take 50 mg by mouth daily.        Marland Kitchen tiotropium (SPIRIVA HANDIHALER) 18 MCG inhalation capsule Place 1 capsule (18 mcg total) into inhaler and inhale daily.  90 capsule  2  . valsartan-hydrochlorothiazide (DIOVAN-HCT) 160-12.5 MG per tablet Take 1 tablet by mouth daily.  30 tablet  0    Review of Systems:   General: Denies fever, chills, sweats. No significant weight loss. Eyes: Denies blurring,significant itching ENT: Denies earache, sore throat, and hoarseness.  Cardiovascular: Denies chest pains, palpitations, dyspnea on exertion,  Respiratory: Denies cough, dyspnea at rest,wheeezing Breast: no concerns about lumps GI: Denies nausea, vomiting, diarrhea, constipation, change in bowel habits, abdominal pain, melena, hematochezia GU: Denies dysuria, hematuria, urinary hesitancy, nocturia, denies STD risk, no concerns about discharge Musculoskeletal: Denies back pain, joint pain Derm: Denies rash, itching Neuro: Denies  paresthesias, frequent falls, frequent headaches Psych: Denies depression, anxiety Endocrine: Denies cold intolerance, heat intolerance, polydipsia Heme: Denies enlarged lymph nodes Allergy: No hayfever  Physical Examination: Filed Vitals:   11/08/11 0826  BP: 120/70  Pulse: 72  Temp: 97.8 F (36.6 C)   Filed Vitals:   11/08/11 0826  Height: 5' 2.5" (1.588 m)  Weight: 166 lb 8 oz (75.524 kg)   Body mass index is 29.97 kg/(m^2). Ideal Body Weight: Weight in (lb) to have BMI = 25: 138.6    GEN: well developed, well nourished, no acute distress Eyes: conjunctiva and lids normal, PERRLA, EOMI ENT: TM clear, nares clear, oral exam WNL Neck: supple, no lymphadenopathy, no thyromegaly, no JVD Pulm: clear to auscultation and percussion, respiratory effort normal CV: regular rate and rhythm, S1-S2, no murmur, rub or gallop, no bruits Chest: no scars, masses, no lumps BREAST: normal breast tissue without mass, no axillary LAD, nipple  tissue normal in texture.  GI: soft, non-tender; no hepatosplenomegaly, masses; active  bowel sounds all quadrants GU: GU exam declined Lymph: no cervical, axillary or inguinal adenopathy MSK: gait normal, muscle tone and strength WNL, no joint swelling, effusions, discoloration, crepitus  SKIN: clear, good turgor, color WNL, no rashes, lesions, or ulcerations Neuro: normal mental status, normal strength, sensation, and motion Psych: alert; oriented to person, place and time, normally interactive and not anxious or depressed in appearance.   Assessment and Plan: Medicare Wellness exam, doing very well.  I have personally reviewed the Medicare Annual Wellness questionnaire and have noted 1. The patient's medical and social history 2. Their use of alcohol, tobacco or illicit drugs 3. Their current medications and supplements 4. The patient's functional ability including ADL's, fall risks, home safety risks and hearing or visual             impairment. 5. Diet and physical activities 6. Evidence for depression or mood disorders  The patients weight, height, BMI and visual acuity have been recorded in the chart I have made referrals, counseling and provided education to the patient based review of the above and I have provided the pt with a written personalized care plan for preventive services.  I have provided the patient with a copy of your personalized plan for preventive services. Instructed to take the time to review along with their updated medication list.   Hannah Beat, MD

## 2011-11-09 MED ORDER — VALSARTAN-HYDROCHLOROTHIAZIDE 160-12.5 MG PO TABS: 1.0000 | ORAL_TABLET | Freq: Every day | ORAL | Status: DC

## 2011-11-22 ENCOUNTER — Telehealth: Payer: Self-pay | Admitting: *Deleted

## 2011-11-22 DIAGNOSIS — Z1211 Encounter for screening for malignant neoplasm of colon: Secondary | ICD-10-CM

## 2011-11-22 NOTE — Telephone Encounter (Signed)
I believe patient wanted [facility]

## 2011-11-22 NOTE — Telephone Encounter (Signed)
Spoke with patient and notified of 5 year recall on colonoscopy. She asked that we schedule her for that. I advised to await call from [facility]. Please place order in chart. Thanks!

## 2012-01-05 ENCOUNTER — Telehealth: Payer: Self-pay | Admitting: Family Medicine

## 2012-01-05 ENCOUNTER — Other Ambulatory Visit: Payer: Self-pay | Admitting: Family Medicine

## 2012-01-05 MED ORDER — AMLODIPINE-ATORVASTATIN 10-20 MG PO TABS: 1.0000 | ORAL_TABLET | Freq: Every day | ORAL | Status: DC

## 2012-01-05 NOTE — Telephone Encounter (Signed)
Pt prescribed Caduet and directions are written to take 1 tablet by mouth daily and also 1/2 by mouth daily.  Pharmacist not sure which the patient is supposed to take or if the patient should do both.

## 2012-01-06 NOTE — Telephone Encounter (Signed)
Ask the patient -   Is she taking a 1/2 tablet or 1 full tablet of Caduet? We have it written down both ways, clarify and refill.

## 2012-01-08 ENCOUNTER — Telehealth: Payer: Self-pay

## 2012-01-08 NOTE — Telephone Encounter (Signed)
Correction: Immunization done at 76 years old, no further pneumovax is needed.

## 2012-01-08 NOTE — Telephone Encounter (Signed)
Pt left v/m requesting pneumonia vaccine; pt had last pneumonia vaccine 12/01/2005; pt was 70 then, does pt need another pneumovax?Please advise.

## 2012-01-08 NOTE — Telephone Encounter (Signed)
Left message on machine to call back  

## 2012-01-08 NOTE — Telephone Encounter (Signed)
Spoke to patient and was advised that she takes 1/2 daily. Pharmacist notified as instructed by telephone.

## 2012-01-08 NOTE — Telephone Encounter (Signed)
Patient notified as instructed by telephone. 

## 2012-01-08 NOTE — Telephone Encounter (Signed)
Pneumovax is repeated 5 years after the initial vaccine.  yes

## 2012-03-19 ENCOUNTER — Other Ambulatory Visit: Payer: Self-pay | Admitting: *Deleted

## 2012-03-19 MED ORDER — TIOTROPIUM BROMIDE MONOHYDRATE 18 MCG IN CAPS: 18.0000 ug | ORAL_CAPSULE | Freq: Every day | RESPIRATORY_TRACT | Status: DC

## 2012-05-15 ENCOUNTER — Encounter: Payer: Self-pay | Admitting: Family Medicine

## 2012-05-15 ENCOUNTER — Ambulatory Visit (INDEPENDENT_AMBULATORY_CARE_PROVIDER_SITE_OTHER): Payer: Self-pay | Admitting: Family Medicine

## 2012-05-15 VITALS — BP 130/74 | HR 80 | Temp 98.5°F | Ht 62.5 in | Wt 173.2 lb

## 2012-05-15 MED ORDER — ALBUTEROL SULFATE HFA 108 (90 BASE) MCG/ACT IN AERS: 2.0000 | INHALATION_SPRAY | Freq: Four times a day (QID) | RESPIRATORY_TRACT | Status: DC | PRN

## 2012-05-15 MED ORDER — AZITHROMYCIN 250 MG PO TABS: ORAL_TABLET | ORAL | Status: DC

## 2012-05-15 MED ORDER — HYDROCODONE-HOMATROPINE 5-1.5 MG/5ML PO SYRP: ORAL_SOLUTION | ORAL | Status: DC

## 2012-05-15 NOTE — Progress Notes (Signed)
Nature conservation officer at [facility] [address] [place] [address] Phone: [phone] Fax: [phone]  Date:  05/15/2012   Name:  Rebecca Hamilton   DOB:  Jul 12, 1935   MRN:  [id number] Gender: female Age: 77 y.o.  Primary Physician:  Hannah Beat, MD  Evaluating MD: Hannah Beat, MD   Chief Complaint: Cough and Fatigue   History of Present Illness:  Rebecca Hamilton is a 77 y.o. pleasant patient who presents with the following:  Sore throat, husband gave strong cough syrup from her husband.  Got bad the last two days, and have been taking 5 days Was bringing things up She has been coughing fairly significantly, does have a history of COPD on Spiriva. She has been wheezing, notably at nighttime, but she takes it is is better somewhat today.  She has been afebrile. She is eating and drinking normally. No chills. No nausea, vomiting, or diarrhea.   Patient Active Problem List  Diagnosis  . HYPERLIPIDEMIA  . UNSPECIFIED ANEMIA  . HYPERTENSION  . COPD  . GERD  . OSTEOPENIA  . FATIGUE  . COLONIC POLYPS, HX OF  . History of abnormal Pap smear    Past Medical History  Diagnosis Date  . COPD (chronic obstructive pulmonary disease)   . GERD (gastroesophageal reflux disease)   . Osteopenia   . HTN (hypertension)   . Hyperlipidemia   . Colon polyps     History reviewed. No pertinent past surgical history.  History   Social History  . Marital Status: Married    Spouse Name: N/A    Number of Children: N/A  . Years of Education: N/A   Occupational History  . retired    Social History Main Topics  . Smoking status: Former Games developer  . Smokeless tobacco: Never Used     Comment: quit 1988  . Alcohol Use: Yes     Comment: daily  . Drug Use: No  . Sexually Active: Not on file   Other Topics Concern  . Not on file   Social History Narrative   Regular exercise--no    No family history on file.  Allergies  Allergen Reactions  . Codeine      Medication list has been reviewed and updated.  Outpatient Prescriptions Prior to Visit  Medication Sig Dispense Refill  . amlodipine-atorvastatin (CADUET) 10-20 MG per tablet Take 1/2 by mouth daily      . aspirin 81 MG tablet Take 81 mg by mouth daily.        . bimatoprost (LUMIGAN) 0.03 % ophthalmic solution Place 1 drop into both eyes at bedtime.       . calcium carbonate (OS-CAL) 600 MG TABS Take 600 mg by mouth 2 (two) times daily with a meal.       . Cholecalciferol (VITAMIN D) 2000 UNITS CAPS Take by mouth 2 (two) times daily.      . dorzolamide (TRUSOPT) 2 % ophthalmic solution Place 1 drop into the right eye 2 (two) times daily.      . Glucosamine-Chondroitin (GLUCOSAMINE CHONDR COMPLEX PO) Take by mouth 2 (two) times daily.      . Thiamine HCl (VITAMIN B-1) 50 MG tablet Take 50 mg by mouth daily.        Marland Kitchen tiotropium (SPIRIVA HANDIHALER) 18 MCG inhalation capsule Place 1 capsule (18 mcg total) into inhaler and inhale daily.  90 capsule  2  . valsartan-hydrochlorothiazide (DIOVAN-HCT) 160-12.5 MG per tablet Take 1 tablet by mouth daily.  30 tablet  6  . vitamin E 400 UNIT capsule Take 400 Units by mouth daily.       No facility-administered medications prior to visit.    Review of Systems:  ROS: GEN: Acute illness details above GI: Tolerating PO intake GU: maintaining adequate hydration and urination Pulm: No SOB Interactive and getting along well at home.  Otherwise, ROS is as per the HPI.   Physical Examination: BP 130/74  Pulse 80  Temp(Src) 98.5 F (36.9 C) (Oral)  Ht 5' 2.5" (1.588 m)  Wt 173 lb 4 oz (78.586 kg)  BMI 31.16 kg/m2  SpO2 97%  Ideal Body Weight: Weight in (lb) to have BMI = 25: 138.6   GEN: A and O x 3. WDWN. NAD.    ENT: Nose clear, ext NML.  No LAD.  No JVD.  TM's clear. Oropharynx clear.  PULM: Normal WOB, no distress. No crackles, rare wheezes, but no rhonchi. CV: RRR, no M/G/R, No rubs, No JVD.   EXT: warm and well-perfused, No  c/c/e. PSYCH: Pleasant and conversant.   Assessment and Plan:  Acute bronchitis  COPD exacerbation  Mild COPD exacerbation with some bronchitis. Gave her some albuterol to use on a when necessary basis and some cough syrup  Orders Today:  No orders of the defined types were placed in this encounter.    Updated Medication List: (Includes new medications, updates to list, dose adjustments) Meds ordered this encounter  Medications  . HYDROcodone-homatropine (HYCODAN) 5-1.5 MG/5ML syrup    Sig: 1 tsp po at night before bed prn cough    Dispense:  240 mL    Refill:  0  . azithromycin (ZITHROMAX) 250 MG tablet    Sig: 2 tabs po on day 1, then 1 tab po for 4 days    Dispense:  6 tablet    Refill:  0  . albuterol (PROVENTIL HFA;VENTOLIN HFA) 108 (90 BASE) MCG/ACT inhaler    Sig: Inhale 2 puffs into the lungs every 6 (six) hours as needed for wheezing.    Dispense:  1 Inhaler    Refill:  3    Medications Discontinued: There are no discontinued medications.    Signed, Elpidio Galea. Copland, MD 05/15/2012 11:34 AM

## 2012-05-28 ENCOUNTER — Telehealth: Payer: Self-pay

## 2012-05-28 NOTE — Telephone Encounter (Signed)
I am fine with stronger cough med, but if she is still coughing, i would do a short course of prednisone - i would suspect the extended cough from copd  tussionex susp. 1 tsp po qhs prn cough. 240 mL  Prednisone 20 mg, 2 tabs po x 3 days, then 1 tab po x 3 days. #9, 0 refills

## 2012-05-28 NOTE — Telephone Encounter (Signed)
Pt seen 05/15/12 and pt still has productive cough with yellow phlegm, S/T no better due to coughing, laryngitis now, wheezing the same as when seen, no fever. Pt request stronger cough med than Hycodan. [employer].Please advise.

## 2012-05-28 NOTE — Telephone Encounter (Signed)
rx called to pharmacy and patient advised

## 2012-07-23 ENCOUNTER — Other Ambulatory Visit: Payer: Self-pay

## 2012-07-23 MED ORDER — VALSARTAN-HYDROCHLOROTHIAZIDE 160-12.5 MG PO TABS: 1.0000 | ORAL_TABLET | Freq: Every day | ORAL | Status: DC

## 2012-07-23 NOTE — Telephone Encounter (Signed)
[facility] faxed refill valsartan/HCTZ # 30 x 3 with note pt needs to call for appt. Last filled 06/20/12.

## 2012-08-12 ENCOUNTER — Ambulatory Visit: Payer: Self-pay | Admitting: Family Medicine

## 2012-08-13 ENCOUNTER — Encounter: Payer: Self-pay | Admitting: Family Medicine

## 2012-08-14 ENCOUNTER — Encounter: Payer: Self-pay | Admitting: *Deleted

## 2012-11-05 ENCOUNTER — Other Ambulatory Visit: Payer: Self-pay | Admitting: Family Medicine

## 2012-11-05 DIAGNOSIS — Z79899 Other long term (current) drug therapy: Secondary | ICD-10-CM

## 2012-11-05 DIAGNOSIS — R5381 Other malaise: Secondary | ICD-10-CM

## 2012-11-05 DIAGNOSIS — E785 Hyperlipidemia, unspecified: Secondary | ICD-10-CM

## 2012-11-06 ENCOUNTER — Other Ambulatory Visit (INDEPENDENT_AMBULATORY_CARE_PROVIDER_SITE_OTHER): Payer: Medicare PPO

## 2012-11-06 DIAGNOSIS — E785 Hyperlipidemia, unspecified: Secondary | ICD-10-CM

## 2012-11-06 DIAGNOSIS — R5381 Other malaise: Secondary | ICD-10-CM

## 2012-11-06 DIAGNOSIS — Z79899 Other long term (current) drug therapy: Secondary | ICD-10-CM

## 2012-11-06 LAB — CBC WITH DIFFERENTIAL/PLATELET
Eosinophils Relative: 2.8 % (ref 0.0–5.0)
HCT: 36.5 % (ref 36.0–46.0)
Lymphs Abs: 1.7 10*3/uL (ref 0.7–4.0)
Monocytes Relative: 8.2 % (ref 3.0–12.0)
Neutrophils Relative %: 58.9 % (ref 43.0–77.0)
Platelets: 213 10*3/uL (ref 150.0–400.0)
RBC: 3.96 Mil/uL (ref 3.87–5.11)
WBC: 5.7 10*3/uL (ref 4.5–10.5)

## 2012-11-07 LAB — HEPATIC FUNCTION PANEL
ALT: 18 U/L (ref 0–35)
AST: 18 U/L (ref 0–37)
Bilirubin, Direct: 0.1 mg/dL (ref 0.0–0.3)
Total Bilirubin: 0.8 mg/dL (ref 0.3–1.2)

## 2012-11-07 LAB — TSH: TSH: 3.41 u[IU]/mL (ref 0.35–5.50)

## 2012-11-07 LAB — LIPID PANEL
HDL: 65.6 mg/dL (ref >39.00)
Total CHOL/HDL Ratio: 2

## 2012-11-07 LAB — BASIC METABOLIC PANEL
BUN: 12 mg/dL (ref 6–23)
Calcium: 9.5 mg/dL (ref 8.4–10.5)
GFR: 66.53 mL/min (ref >60.00)
Glucose, Bld: 106 mg/dL — ABNORMAL HIGH (ref 70–99)

## 2012-11-13 ENCOUNTER — Encounter: Payer: Self-pay | Admitting: Family Medicine

## 2012-11-13 ENCOUNTER — Encounter: Payer: Self-pay | Admitting: Internal Medicine

## 2012-11-13 ENCOUNTER — Ambulatory Visit (INDEPENDENT_AMBULATORY_CARE_PROVIDER_SITE_OTHER): Payer: Medicare PPO | Admitting: Family Medicine

## 2012-11-13 VITALS — BP 120/70 | HR 77 | Temp 98.1°F | Ht 62.0 in | Wt 166.8 lb

## 2012-11-13 DIAGNOSIS — Z0000 Encounter for general adult medical examination without abnormal findings: Secondary | ICD-10-CM

## 2012-11-13 DIAGNOSIS — Z86010 Personal history of colonic polyps: Secondary | ICD-10-CM

## 2012-11-13 NOTE — Patient Instructions (Addendum)
REFERRAL: GO THE THE FRONT ROOM AT THE ENTRANCE OF OUR CLINIC, NEAR CHECK IN. ASK FOR MARION. SHE WILL HELP YOU SET UP YOUR REFERRAL. DATE: TIME:  

## 2012-11-13 NOTE — Progress Notes (Signed)
Nature conservation officer at [facility] [address] [place] [address] Phone: [phone] Fax: [phone]  Date:  11/13/2012   Name:  Rebecca Hamilton   DOB:  Jul 11, 1935   MRN:  [id number] Gender: female Age: 77 y.o.  Primary Physician:  Hannah Beat, MD  Evaluating MD: Hannah Beat, MD   Chief Complaint: Annual Exam   History of Present Illness:  Rebecca Hamilton is a 77 y.o. pleasant patient who presents with the following:  Medicare wellness:  Dr. Mechele Collin.  Health Maintenance Summary Reviewed and updated, unless pt declines services.  Tobacco History Reviewed. Non-smoker Alcohol: No concerns, no excessive use Exercise Habits: some activity STD concerns: none Drug Use: None Birth control method: n/a Menses regular: yes Lumps or breast concerns: no  Health Maintenance  Topic Date Due  . Influenza Vaccine  11/15/2013  . Mammogram  08/12/2013  . Tetanus/tdap  12/02/2015  . Colonoscopy  12/26/2016  . Pneumococcal Polysaccharide Vaccine Age 35 And Over  Completed  . Zostavax  Completed    Labs reviewed with the patient.  Results for orders placed in visit on 11/06/12  LIPID PANEL      Result Value Range   Cholesterol 157  0 - 200 mg/dL   Triglycerides 865.7  0.0 - 149.0 mg/dL   HDL 84.69  >62.95 mg/dL   VLDL 28.4  0.0 - 13.2 mg/dL   LDL Cholesterol 70  0 - 99 mg/dL   Total CHOL/HDL Ratio 2    CBC WITH DIFFERENTIAL      Result Value Range   WBC 5.7  4.5 - 10.5 K/uL   RBC 3.96  3.87 - 5.11 Mil/uL   Hemoglobin 12.4  12.0 - 15.0 g/dL   HCT 44.0  10.2 - 72.5 %   MCV 92.1  78.0 - 100.0 fl   MCHC 34.0  30.0 - 36.0 g/dL   RDW 36.6  44.0 - 34.7 %   Platelets 213.0  150.0 - 400.0 K/uL   Neutrophils Relative % 58.9  43.0 - 77.0 %   Lymphocytes Relative 29.4  12.0 - 46.0 %   Monocytes Relative 8.2  3.0 - 12.0 %   Eosinophils Relative 2.8  0.0 - 5.0 %   Basophils Relative 0.7  0.0 - 3.0 %   Neutro Abs 3.4  1.4 - 7.7 K/uL   Lymphs Abs 1.7  0.7 - 4.0  K/uL   Monocytes Absolute 0.5  0.1 - 1.0 K/uL   Eosinophils Absolute 0.2  0.0 - 0.7 K/uL   Basophils Absolute 0.0  0.0 - 0.1 K/uL  HEPATIC FUNCTION PANEL      Result Value Range   Total Bilirubin 0.8  0.3 - 1.2 mg/dL   Bilirubin, Direct 0.1  0.0 - 0.3 mg/dL   Alkaline Phosphatase 47  39 - 117 U/L   AST 18  0 - 37 U/L   ALT 18  0 - 35 U/L   Total Protein 6.9  6.0 - 8.3 g/dL   Albumin 4.0  3.5 - 5.2 g/dL  BASIC METABOLIC PANEL      Result Value Range   Sodium 135  135 - 145 mEq/L   Potassium 4.6  3.5 - 5.1 mEq/L   Chloride 101  96 - 112 mEq/L   CO2 28  19 - 32 mEq/L   Glucose, Bld 106 (*) 70 - 99 mg/dL   BUN 12  6 - 23 mg/dL   Creatinine, Ser 0.9  0.4 - 1.2 mg/dL  Calcium 9.5  8.4 - 10.5 mg/dL   GFR 40.98  >11.91 mL/min  TSH      Result Value Range   TSH 3.41  0.35 - 5.50 uIU/mL     Patient Active Problem List   Diagnosis Date Noted  . History of abnormal Pap smear 10/11/2010  . FATIGUE 09/01/2009  . OSTEOPENIA 06/25/2008  . HYPERLIPIDEMIA 05/22/2008  . UNSPECIFIED ANEMIA 05/22/2008  . HYPERTENSION 05/22/2008  . COPD 05/22/2008  . GERD 05/22/2008  . COLONIC POLYPS, HX OF 05/22/2008    Past Medical History  Diagnosis Date  . COPD (chronic obstructive pulmonary disease)   . GERD (gastroesophageal reflux disease)   . Osteopenia   . HTN (hypertension)   . Hyperlipidemia   . Colon polyps     No past surgical history on file.  History   Social History  . Marital Status: Married    Spouse Name: N/A    Number of Children: N/A  . Years of Education: N/A   Occupational History  . retired    Social History Main Topics  . Smoking status: Former Games developer  . Smokeless tobacco: Never Used     Comment: quit 1988  . Alcohol Use: Yes     Comment: daily  . Drug Use: No  . Sexual Activity: Not on file   Other Topics Concern  . Not on file   Social History Narrative   Regular exercise--no    No family history on file.  Allergies  Allergen Reactions  .  Codeine     Medication list has been reviewed and updated.  Outpatient Prescriptions Prior to Visit  Medication Sig Dispense Refill  . albuterol (PROVENTIL HFA;VENTOLIN HFA) 108 (90 BASE) MCG/ACT inhaler Inhale 2 puffs into the lungs every 6 (six) hours as needed for wheezing.  1 Inhaler  3  . amlodipine-atorvastatin (CADUET) 10-20 MG per tablet Take 1/2 by mouth daily      . aspirin 81 MG tablet Take 81 mg by mouth daily.        . bimatoprost (LUMIGAN) 0.03 % ophthalmic solution Place 1 drop into both eyes at bedtime.       . calcium carbonate (OS-CAL) 600 MG TABS Take 600 mg by mouth 2 (two) times daily with a meal.       . Cholecalciferol (VITAMIN D) 2000 UNITS CAPS Take by mouth 2 (two) times daily.      . dorzolamide (TRUSOPT) 2 % ophthalmic solution Place 1 drop into the right eye 2 (two) times daily.      Marland Kitchen tiotropium (SPIRIVA HANDIHALER) 18 MCG inhalation capsule Place 1 capsule (18 mcg total) into inhaler and inhale daily.  90 capsule  2  . valsartan-hydrochlorothiazide (DIOVAN-HCT) 160-12.5 MG per tablet Take 1 tablet by mouth daily.  30 tablet  3  . Glucosamine-Chondroitin (GLUCOSAMINE CHONDR COMPLEX PO) Take by mouth 2 (two) times daily.      . Thiamine HCl (VITAMIN B-1) 50 MG tablet Take 50 mg by mouth daily.        . vitamin E 400 UNIT capsule Take 400 Units by mouth daily.       No facility-administered medications prior to visit.    Review of Systems:   General: Denies fever, chills, sweats. No significant weight loss. Eyes: Denies blurring,significant itching ENT: Denies earache, sore throat, and hoarseness.  Cardiovascular: Denies chest pains, palpitations, dyspnea on exertion,  Respiratory: Denies cough, dyspnea at rest,wheeezing Breast: no concerns about lumps GI: Denies nausea, vomiting, diarrhea,  constipation, change in bowel habits, abdominal pain, melena, hematochezia GU: Denies dysuria, hematuria, urinary hesitancy, nocturia, denies STD risk, no concerns  about discharge Musculoskeletal: Denies back pain, joint pain Derm: Denies rash, itching Neuro: Denies  paresthesias, frequent falls, frequent headaches Psych: Denies depression, anxiety Endocrine: Denies cold intolerance, heat intolerance, polydipsia Heme: Denies enlarged lymph nodes Allergy: No hayfever   Physical Examination: BP 120/70  Pulse 77  Temp(Src) 98.1 F (36.7 C) (Oral)  Ht 5\' 2"  (1.575 m)  Wt 166 lb 12 oz (75.637 kg)  BMI 30.49 kg/m2  Ideal Body Weight: Weight in (lb) to have BMI = 25: 136.4   GEN: well developed, well nourished, no acute distress Eyes: conjunctiva and lids normal, PERRLA, EOMI ENT: TM clear, nares clear, oral exam WNL Neck: supple, no lymphadenopathy, no thyromegaly, no JVD Pulm: clear to auscultation and percussion, respiratory effort normal CV: regular rate and rhythm, S1-S2, no murmur, rub or gallop, no bruits Chest: no scars, masses, no lumps BREAST: breast exam: no masses, lumps.  GI: soft, non-tender; no hepatosplenomegaly, masses; active bowel sounds all quadrants GU: GU exam declined Lymph: no cervical, axillary or inguinal adenopathy MSK: gait normal, muscle tone and strength WNL, no joint swelling, effusions, discoloration, crepitus  SKIN: clear, good turgor, color WNL, no rashes, lesions, or ulcerations Neuro: normal mental status, normal strength, sensation, and motion Psych: alert; oriented to person, place and time, normally interactive and not anxious or depressed in appearance.    Assessment and Plan:  Routine general medical examination at a health care facility  COLONIC POLYPS, HX OF - Plan: Ambulatory referral to Gastroenterology  I have personally reviewed the Medicare Annual Wellness questionnaire and have noted 1. The patient's medical and social history 2. Their use of alcohol, tobacco or illicit drugs 3. Their current medications and supplements 4. The patient's functional ability including ADL's, fall risks,  home safety risks and hearing or visual             impairment. 5. Diet and physical activities 6. Evidence for depression or mood disorders  The patients weight, height, BMI and visual acuity have been recorded in the chart I have made referrals, counseling and provided education to the patient based review of the above and I have provided the pt with a written personalized care plan for preventive services.  I have provided the patient with a copy of your personalized plan for preventive services. Instructed to take the time to review along with their updated medication list.   Overall, doing well. Colonoscopy last year with inadequate prep - unclear if barium enema needed or repeat colon. This was done at [facility], so she asked to see a different GI MD.  Orders Today:  Orders Placed This Encounter  Procedures  . Ambulatory referral to Gastroenterology    Referral Priority:  Routine    Referral Type:  Consultation    Referral Reason:  Specialty Services Required    Requested Specialty:  Gastroenterology    Number of Visits Requested:  1    Updated Medication List: (Includes new medications, updates to list, dose adjustments) No orders of the defined types were placed in this encounter.    Medications Discontinued: There are no discontinued medications.    Signed, Elpidio Galea. Aeriana Speece, MD 11/13/2012 8:53 AM

## 2012-11-22 ENCOUNTER — Other Ambulatory Visit: Payer: Self-pay | Admitting: Family Medicine

## 2012-12-09 ENCOUNTER — Encounter: Payer: Self-pay | Admitting: Internal Medicine

## 2012-12-16 ENCOUNTER — Ambulatory Visit (INDEPENDENT_AMBULATORY_CARE_PROVIDER_SITE_OTHER): Payer: Medicare PPO | Admitting: Internal Medicine

## 2012-12-16 ENCOUNTER — Encounter: Payer: Self-pay | Admitting: Internal Medicine

## 2012-12-16 VITALS — BP 126/70 | HR 72 | Ht 62.0 in | Wt 167.6 lb

## 2012-12-16 DIAGNOSIS — Z1211 Encounter for screening for malignant neoplasm of colon: Secondary | ICD-10-CM

## 2012-12-16 DIAGNOSIS — Z86010 Personal history of colonic polyps: Secondary | ICD-10-CM

## 2012-12-16 NOTE — Patient Instructions (Addendum)
You have been schedule for a virtual colonoscopy at [facility] located at [address] Rebecca Hamilton  [phone]   12/23/2012 @ 9:30am  You will need to stop by their office to pick up the prep kit.                                                We are excited to introduce MyChart, a new best-in-class service that provides you online access to important information in your electronic medical record. We want to make it easier for you to view your health information - all in one secure location - when and where you need it. We expect MyChart will enhance the quality of care and service we provide.  When you register for MyChart, you can:    View your test results.    Request appointments and receive appointment reminders via email.    Request medication renewals.    View your medical history, allergies, medications and immunizations.    Communicate with your physician's office through a password-protected site.    Conveniently print information such as your medication lists.  To find out if MyChart is right for you, please talk to a member of our clinical staff today. We will gladly answer your questions about this free health and wellness tool.  If you are age 75 or older and want a member of your family to have access to your record, you must provide written consent by completing a proxy form available at our office. Please speak to our clinical staff about guidelines regarding accounts for patients younger than age 75.  As you activate your MyChart account and need any technical assistance, please call the MyChart technical support line at [phone]) or email your question to [email or web address]. If you email your question(s), please include your name, a return phone number and the best time to reach you.  If you have non-urgent health-related questions, you can send a message to our office through MyChart at [email or web address]. If you have a medical  emergency, call 911.  Thank you for using MyChart as your new health and wellness resource!   MyChart licensed from [employer],  9562-1308. Patents Pending.

## 2012-12-16 NOTE — Progress Notes (Signed)
Patient ID: Rebecca Hamilton, female   DOB: 27-Jan-1936, 77 y.o.   MRN: [id number] HPI: [name] is a 77 year old female with a past medical history of GERD, hypertension, hyperlipidemia, colon polyps, diverticulosis was seen in consultation at the request of Dr. Patsy Lager to discuss history of colon polyps, recent history of incomplete colonoscopy and colorectal cancer screening.  She is alone today. She reports she is feeling well. She denies abdominal pain. No change in bowel habits. No rectal bleeding or melena. Good appetite, no weight loss. No significant heartburn, dysphagia or odynophagia. She reports previous colonoscopies performed in [facility]. She recalls 2 colonoscopies, the first of colon polyps with the recommendation of a 5 year surveillance interval. Her second colonoscopy, which she feels was a little longer than 5 years, occurred on 12/27/2011. This examination was incomplete reaching only the ascending colon. The exam was noted to be technically difficult and complex do to "restricted mobility of the colon". No polyps were seen, external hemorrhoids were documented. The patient denies a family history of colon polyps or cancer. She reports very minimal was recommended after her incomplete colonoscopy, but this was not performed. She reports feeling nervous about this test because she would not be sedated. She reports her fears are mostly that this test would be painful. She is aware of her history of diverticulosis, but denies a history of diverticulitis.  Past Medical History  Diagnosis Date  . COPD (chronic obstructive pulmonary disease)   . GERD (gastroesophageal reflux disease)   . Osteopenia   . HTN (hypertension)   . Hyperlipidemia   . Colon polyps     History reviewed. No pertinent past surgical history.  Current Outpatient Prescriptions  Medication Sig Dispense Refill  . albuterol (PROVENTIL HFA;VENTOLIN HFA) 108 (90 BASE) MCG/ACT inhaler Inhale 2 puffs into  the lungs every 6 (six) hours as needed for wheezing.  1 Inhaler  3  . amlodipine-atorvastatin (CADUET) 10-20 MG per tablet Take 1/2 by mouth daily      . aspirin 81 MG tablet Take 81 mg by mouth daily.        . bimatoprost (LUMIGAN) 0.03 % ophthalmic solution Place 1 drop into both eyes at bedtime.       . calcium carbonate (OS-CAL) 600 MG TABS Take 600 mg by mouth 2 (two) times daily with a meal.       . Cholecalciferol (VITAMIN D) 2000 UNITS CAPS Take by mouth 2 (two) times daily.      . dorzolamide (TRUSOPT) 2 % ophthalmic solution Place 1 drop into the right eye 2 (two) times daily.      Marland Kitchen tiotropium (SPIRIVA HANDIHALER) 18 MCG inhalation capsule Place 1 capsule (18 mcg total) into inhaler and inhale daily.  90 capsule  2  . valsartan-hydrochlorothiazide (DIOVAN-HCT) 160-12.5 MG per tablet TAKE 1 TABLET BY MOUTH ONCE A DAY  30 tablet  5  . vitamin E 400 UNIT capsule Take 400 Units by mouth daily.       No current facility-administered medications for this visit.    Allergies  Allergen Reactions  . Codeine     Family History  Problem Relation Age of Onset  . Diabetes Father   . Diabetes Brother   . Heart disease Mother   . Heart disease Father     History  Substance Use Topics  . Smoking status: Former Games developer  . Smokeless tobacco: Never Used     Comment: quit 1988  . Alcohol Use: Yes  Comment: daily    ROS: As per history of present illness, otherwise negative  BP 126/70  Pulse 72  Ht 5\' 2"  (1.575 m)  Wt 167 lb 9.6 oz (76.023 kg)  BMI 30.65 kg/m2 Constitutional: Well-developed and well-nourished. No distress. HEENT: Normocephalic and atraumatic. Oropharynx is clear and moist. No oropharyngeal exudate. Conjunctivae are normal.  No scleral icterus. Neck: Neck supple. Trachea midline. Cardiovascular: Normal rate, regular rhythm and intact distal pulses.  Pulmonary/chest: Effort normal and breath sounds normal. No wheezing, rales or rhonchi. Abdominal: Soft, obese,  nontender, nondistended. Bowel sounds active throughout.  Extremities: no clubbing, cyanosis, or edema Lymphadenopathy: No cervical adenopathy noted. Neurological: Alert and oriented to person place and time. Psychiatric: Normal mood and affect. Behavior is normal.  RELEVANT LABS AND IMAGING: CBC    Component Value Date/Time   WBC 5.7 11/06/2012 0839   RBC 3.96 11/06/2012 0839   HGB 12.4 11/06/2012 0839   HCT 36.5 11/06/2012 0839   PLT 213.0 11/06/2012 0839   MCV 92.1 11/06/2012 0839   MCHC 34.0 11/06/2012 0839   RDW 13.3 11/06/2012 0839   LYMPHSABS 1.7 11/06/2012 0839   MONOABS 0.5 11/06/2012 0839   EOSABS 0.2 11/06/2012 0839   BASOSABS 0.0 11/06/2012 0839    CMP     Component Value Date/Time   NA 135 11/06/2012 0839   K 4.6 11/06/2012 0839   CL 101 11/06/2012 0839   CO2 28 11/06/2012 0839   GLUCOSE 106* 11/06/2012 0839   BUN 12 11/06/2012 0839   CREATININE 0.9 11/06/2012 0839   CALCIUM 9.5 11/06/2012 0839   PROT 6.9 11/06/2012 0839   ALBUMIN 4.0 11/06/2012 0839   AST 18 11/06/2012 0839   ALT 18 11/06/2012 0839   ALKPHOS 47 11/06/2012 0839   BILITOT 0.8 11/06/2012 0839   GFRNONAA 51.34 09/01/2009 0933   Colonoscopy, date 12/27/2011, Dr.Hashmi, MD - [facility] -- preparation "good", examined the ascending colon.  No polyps identified  ASSESSMENT/PLAN: 77 year old female with a past medical history of GERD, hypertension, hyperlipidemia, colon polyps, diverticulosis was seen in consultation at the request of Dr. Patsy Lager to discuss history of colon polyps, recent history of incomplete colonoscopy and colorectal cancer screening.  1.  Elevated risk colorectal cancer screening/history of colon polyps/hx of incomplete colonoscopy -- we had a long discussion today regarding her history of colon polyps and her incomplete colonoscopy from October 2013. We discussed how her entire colon was not visualized, most likely the cecum and proximal ascending colon, therefore we cannot say  definitively that there are no polyps in this segment. She is at high risk for adenoma given her previous history of precancerous polyps.  I have recommended a subsequent colorectal cancer screening test given the fact that this last colon was incomplete. We discussed options including repeating colonoscopy, with the knowledge that this test could again be difficult and could again be incomplete.  We also discussed barium enema or virtual CT colonoscopy.  I have made her aware that if we attempt colonoscopy, and it is incomplete, the recommendation would be for barium enema or CT colonoscopy.  After a thorough discussion of the risks, benefits and alternatives, she elected for CT colonoscopy. We will order this test, however if it is not covered by her insurance, she will likely proceed to barium enema. Time was provided for questions and answers and she agrees and is happy with the current plan.  Should CT colonoscopy because of prohibitive we will rediscuss barium enema versus attempt at  repeat colonoscopy.

## 2012-12-17 ENCOUNTER — Telehealth: Payer: Self-pay | Admitting: Gastroenterology

## 2012-12-17 NOTE — Telephone Encounter (Signed)
lvm for pt to call me back regarding CT colonoscopy

## 2012-12-18 ENCOUNTER — Telehealth: Payer: Self-pay | Admitting: Gastroenterology

## 2012-12-18 NOTE — Telephone Encounter (Signed)
Pt called back, told her that her insurance will cover the virtual CT, but she will need to call them to see if she is has to pay any money out of pocket, like a possible deductible to meet. Pt said she will call her insurance co.  We also went over prep for a barium enema, pt said she will more likely go with the CT colonoscopy, but will call [facility] to reschedule because she is going to go out of town this weekend and will not have time to prep.

## 2012-12-23 ENCOUNTER — Inpatient Hospital Stay: Admission: RE | Admit: 2012-12-23 | Payer: Medicare PPO | Source: Ambulatory Visit

## 2013-01-17 ENCOUNTER — Ambulatory Visit
Admission: RE | Admit: 2013-01-17 | Discharge: 2013-01-17 | Disposition: A | Discharge status: Home / Self Care | Payer: Medicare PPO | Source: Ambulatory Visit | Attending: Internal Medicine | Admitting: Internal Medicine

## 2013-01-17 DIAGNOSIS — Z86010 Personal history of colonic polyps: Secondary | ICD-10-CM

## 2013-02-14 ENCOUNTER — Other Ambulatory Visit: Payer: Self-pay | Admitting: Family Medicine

## 2013-02-22 ENCOUNTER — Other Ambulatory Visit: Payer: Self-pay | Admitting: Family Medicine

## 2013-06-10 ENCOUNTER — Other Ambulatory Visit: Payer: Self-pay | Admitting: Family Medicine

## 2013-07-03 ENCOUNTER — Other Ambulatory Visit: Payer: Self-pay | Admitting: Family Medicine

## 2013-09-30 ENCOUNTER — Other Ambulatory Visit: Payer: Self-pay | Admitting: Family Medicine

## 2013-10-16 ENCOUNTER — Encounter: Payer: Self-pay | Admitting: Family Medicine

## 2013-10-16 ENCOUNTER — Ambulatory Visit: Payer: Self-pay | Admitting: Family Medicine

## 2013-12-03 ENCOUNTER — Encounter: Payer: Self-pay | Admitting: Family Medicine

## 2013-12-03 ENCOUNTER — Encounter (INDEPENDENT_AMBULATORY_CARE_PROVIDER_SITE_OTHER): Payer: Self-pay

## 2013-12-03 ENCOUNTER — Ambulatory Visit (INDEPENDENT_AMBULATORY_CARE_PROVIDER_SITE_OTHER): Payer: Medicare PPO | Admitting: Family Medicine

## 2013-12-03 VITALS — BP 114/78 | HR 74 | Temp 98.1°F | Ht 62.0 in | Wt 168.5 lb

## 2013-12-03 DIAGNOSIS — I10 Essential (primary) hypertension: Secondary | ICD-10-CM

## 2013-12-03 DIAGNOSIS — Z13220 Encounter for screening for lipoid disorders: Secondary | ICD-10-CM

## 2013-12-03 DIAGNOSIS — D649 Anemia, unspecified: Secondary | ICD-10-CM

## 2013-12-03 DIAGNOSIS — M899 Disorder of bone, unspecified: Secondary | ICD-10-CM

## 2013-12-03 DIAGNOSIS — M949 Disorder of cartilage, unspecified: Secondary | ICD-10-CM

## 2013-12-03 DIAGNOSIS — Z0000 Encounter for general adult medical examination without abnormal findings: Secondary | ICD-10-CM

## 2013-12-03 DIAGNOSIS — R5383 Other fatigue: Secondary | ICD-10-CM

## 2013-12-03 DIAGNOSIS — E785 Hyperlipidemia, unspecified: Secondary | ICD-10-CM

## 2013-12-03 DIAGNOSIS — Z23 Encounter for immunization: Secondary | ICD-10-CM

## 2013-12-03 DIAGNOSIS — R5381 Other malaise: Secondary | ICD-10-CM

## 2013-12-03 LAB — BASIC METABOLIC PANEL
BUN: 14 mg/dL (ref 6–23)
CO2: 26 mEq/L (ref 19–32)
CREATININE: 1 mg/dL (ref 0.4–1.2)
Calcium: 10 mg/dL (ref 8.4–10.5)
Chloride: 102 mEq/L (ref 96–112)
GFR: 57.91 mL/min — AB (ref >60.00)
Glucose, Bld: 71 mg/dL (ref 70–99)
POTASSIUM: 4.1 meq/L (ref 3.5–5.1)
Sodium: 136 mEq/L (ref 135–145)

## 2013-12-03 LAB — CBC WITH DIFFERENTIAL/PLATELET
BASOS ABS: 0 10*3/uL (ref 0.0–0.1)
Basophils Relative: 0.7 % (ref 0.0–3.0)
EOS ABS: 0.7 10*3/uL (ref 0.0–0.7)
Eosinophils Relative: 10 % — ABNORMAL HIGH (ref 0.0–5.0)
HCT: 40.7 % (ref 36.0–46.0)
Hemoglobin: 13.9 g/dL (ref 12.0–15.0)
LYMPHS PCT: 23 % (ref 12.0–46.0)
Lymphs Abs: 1.5 10*3/uL (ref 0.7–4.0)
MCHC: 34.3 g/dL (ref 30.0–36.0)
MCV: 91 fl (ref 78.0–100.0)
Monocytes Absolute: 0.6 10*3/uL (ref 0.1–1.0)
Monocytes Relative: 8.5 % (ref 3.0–12.0)
Neutro Abs: 3.9 10*3/uL (ref 1.4–7.7)
Neutrophils Relative %: 57.8 % (ref 43.0–77.0)
Platelets: 240 10*3/uL (ref 150.0–400.0)
RBC: 4.47 Mil/uL (ref 3.87–5.11)
RDW: 13.6 % (ref 11.5–15.5)
WBC: 6.7 10*3/uL (ref 4.0–10.5)

## 2013-12-03 LAB — HEPATIC FUNCTION PANEL
ALT: 26 U/L (ref 0–35)
AST: 28 U/L (ref 0–37)
Albumin: 4.2 g/dL (ref 3.5–5.2)
Alkaline Phosphatase: 59 U/L (ref 39–117)
BILIRUBIN TOTAL: 0.3 mg/dL (ref 0.2–1.2)
Bilirubin, Direct: 0 mg/dL (ref 0.0–0.3)
TOTAL PROTEIN: 7.4 g/dL (ref 6.0–8.3)

## 2013-12-03 LAB — LIPID PANEL
CHOL/HDL RATIO: 3
Cholesterol: 179 mg/dL (ref 0–200)
HDL: 70 mg/dL (ref >39.00)
LDL Cholesterol: 91 mg/dL (ref 0–99)
NONHDL: 109
Triglycerides: 90 mg/dL (ref 0.0–149.0)
VLDL: 18 mg/dL (ref 0.0–40.0)

## 2013-12-03 LAB — TSH: TSH: 2.44 u[IU]/mL (ref 0.35–4.50)

## 2013-12-03 NOTE — Progress Notes (Signed)
Dr. Frederico Hamman T. Anida Deol, MD, [facility] [place], [place], [address] Phone: [phone] Fax: [phone]  12/03/2013  Patient: Rebecca Hamilton, MRN: [id number], DOB: May 10, 1935, 78 y.o.  Primary Physician:  Owens Loffler, MD  Chief Complaint: Annual Exam  Subjective:   Rebecca Hamilton is a 78 y.o. pleasant patient who presents for a medicare wellness examination:  Health Maintenance Summary Reviewed and updated, unless pt declines services.  Tobacco History Reviewed. Non-smoker Alcohol: No concerns, no excessive use Exercise Habits: Some activity, rec at least 30 mins 5 times a week - decreased some this year STD concerns: none Drug Use: None Birth control method: n/a Menses regular: n/a Lumps or breast concerns: no Breast Cancer Family History: no  She is here for health maintenance exam. She is otherwise doing very well and really has no complaints.She has not been exercising quite as much this year but other than this really has no complaints to be made.  Health Maintenance  Topic Date Due  . Influenza Vaccine  10/12/2014  . Mammogram  10/17/2014  . Tetanus/tdap  12/02/2015  . Colonoscopy  01/17/2018  . Pneumococcal Polysaccharide Vaccine Age 3 And Over  Completed  . Zostavax  Completed   Immunization History  Administered Date(s) Administered  . Hepatitis A 06/26/1995, 12/26/1995  . Hepatitis B 12/01/2005, 01/01/2006, 07/06/2006  . Influenza Split 12/29/2011  . Influenza Whole 03/14/2007  . Influenza,inj,Quad PF,36+ Mos 12/03/2013  . Influenza-Unspecified 12/11/2012  . Pneumococcal Conjugate-13 12/03/2013  . Pneumococcal Polysaccharide-23 03/13/2005, 12/01/2005  . Td 03/13/2005, 12/01/2005  . Zoster 07/06/2006    Patient Active Problem List   Diagnosis Date Noted  . History of abnormal Pap smear 10/11/2010  . FATIGUE 09/01/2009  . OSTEOPENIA 06/25/2008  . HYPERLIPIDEMIA 05/22/2008  . UNSPECIFIED  ANEMIA 05/22/2008  . HYPERTENSION 05/22/2008  . COPD 05/22/2008  . GERD 05/22/2008  . COLONIC POLYPS, HX OF 05/22/2008   Past Medical History  Diagnosis Date  . COPD (chronic obstructive pulmonary disease)   . GERD (gastroesophageal reflux disease)   . Osteopenia   . HTN (hypertension)   . Hyperlipidemia   . Colon polyps    No past surgical history on file. History   Social History  . Marital Status: Married    Spouse Name: N/A    Number of Children: 2  . Years of Education: N/A   Occupational History  . retired    Social History Main Topics  . Smoking status: Former Research scientist (life sciences)  . Smokeless tobacco: Never Used     Comment: quit 1988  . Alcohol Use: Yes     Comment: daily  . Drug Use: No  . Sexual Activity: Not on file   Other Topics Concern  . Not on file   Social History Narrative   Regular exercise--no   Family History  Problem Relation Age of Onset  . Diabetes Father   . Diabetes Brother   . Heart disease Mother   . Heart disease Father    Allergies  Allergen Reactions  . Codeine     Medication list has been reviewed and updated.   General: Denies fever, chills, sweats. No significant weight loss. Eyes: Denies blurring,significant itching ENT: Denies earache, sore throat, and hoarseness.  Cardiovascular: Denies chest pains, palpitations, dyspnea on exertion,  Respiratory: Denies cough, dyspnea at rest,wheeezing Breast: no concerns about lumps GI: Denies nausea, vomiting, diarrhea, constipation, change in bowel habits, abdominal pain, melena, hematochezia GU: Denies dysuria, hematuria, urinary  hesitancy, nocturia, denies STD risk, no concerns about discharge Musculoskeletal: Denies back pain, joint pain Derm: Denies rash, itching Neuro: Denies  paresthesias, frequent falls, frequent headaches Psych: Denies depression, anxiety Endocrine: Denies cold intolerance, heat intolerance, polydipsia Heme: Denies enlarged lymph nodes Allergy: No  hayfever  Objective:   BP 114/78  Pulse 74  Temp(Src) 98.1 F (36.7 C) (Oral)  Ht $R'5\' 2"'xP$  (1.575 m)  Wt 168 lb 8 oz (76.431 kg)  BMI 30.81 kg/m2  SpO2 97%  The patient completed a fall screen and PHQ-2 and PHQ-9 if necessary, which is documented in the EHR. The CMA/LPN/RN who assisted the patient verbally completed with them and documented results in [facility] EHR.  Hearing Screening Comments: Pt wears hearing aids screening test not done Vision Screening Comments: Pt had eye exam on 08/2013 with Dr. Claris Gower at [place] [facility]: well developed, well nourished, no acute distress Eyes: conjunctiva and lids normal, PERRLA, EOMI ENT: TM clear, nares clear, oral exam WNL Neck: supple, no lymphadenopathy, no thyromegaly, no JVD Pulm: clear to auscultation and percussion, respiratory effort normal CV: regular rate and rhythm, S1-S2, no murmur, rub or gallop, no bruits Chest: no scars, masses, no lumps BREAST: breast exam normal. Chaperoned examination. Entirety of breast examined including axilla, and no enlarged lymph nodes. No significant masses are felt. No nipple discharge. No skin changes. Overall, reassuring breast exam.  This portion of the physical examination was chaperoned by Hedy Camara, CMA.  GI: soft, non-tender; no hepatosplenomegaly, masses; active bowel sounds all quadrants GU: GU exam declined Lymph: no cervical, axillary or inguinal adenopathy MSK: gait normal, muscle tone and strength WNL, no joint swelling, effusions, discoloration, crepitus  SKIN: clear, good turgor, color WNL, no rashes, lesions, or ulcerations Neuro: normal mental status, normal strength, sensation, and motion Psych: alert; oriented to person, place and time, normally interactive and not anxious or depressed in appearance.   All labs reviewed with patient. Lipids:    Component Value Date/Time   CHOL 179 12/03/2013 0923   TRIG 90.0 12/03/2013 0923   HDL 70.00 12/03/2013 0923   VLDL  18.0 12/03/2013 0923   CHOLHDL 3 12/03/2013 0923   CBC: CBC Latest Ref Rng 12/03/2013 11/06/2012 11/01/2011  WBC 4.0 - 10.5 K/uL 6.7 5.7 6.0  Hemoglobin 12.0 - 15.0 g/dL 13.9 12.4 14.3  Hematocrit 36.0 - 46.0 % 40.7 36.5 43.7  Platelets 150.0 - 400.0 K/uL 240.0 213.0 219.7    Basic Metabolic Panel:    Component Value Date/Time   NA 136 12/03/2013 0923   K 4.1 12/03/2013 0923   CL 102 12/03/2013 0923   CO2 26 12/03/2013 0923   BUN 14 12/03/2013 0923   CREATININE 1.0 12/03/2013 0923   GLUCOSE 71 12/03/2013 0923   CALCIUM 10.0 12/03/2013 0923   Hepatic Function Latest Ref Rng 12/03/2013 11/06/2012 11/01/2011  Total Protein 6.0 - 8.3 g/dL 7.4 6.9 7.4  Albumin 3.5 - 5.2 g/dL 4.2 4.0 4.1  AST 0 - 37 U/L $Remo'28 18 28  'pJPIY$ ALT 0 - 35 U/L $Remo'26 18 28  'rPPtp$ Alk Phosphatase 39 - 117 U/L 59 47 53  Total Bilirubin 0.2 - 1.2 mg/dL 0.3 0.8 0.7  Bilirubin, Direct 0.0 - 0.3 mg/dL 0.0 0.1 0.1    Lab Results  Component Value Date   TSH 2.44 12/03/2013    No results found.  Assessment and Plan:   Routine general medical examination at a health care facility  Need for prophylactic vaccination and inoculation against  influenza - Plan: Flu Vaccine QUAD 36+ mos PF IM (Fluarix Quad PF)  Screening for lipoid disorders - Plan: Lipid panel  Other malaise and fatigue - Plan: Basic metabolic panel, CBC with Differential, Hepatic function panel, TSH  Need for vaccination with 13-polyvalent pneumococcal conjugate vaccine - Plan: Pneumococcal conjugate vaccine 13-valent  Anemia, unspecified  Other and unspecified hyperlipidemia  Unspecified essential hypertension  Health Maintenance Exam: The patient's preventative maintenance and recommended screening tests for an annual wellness exam were reviewed in full today. Brought up to date unless services declined.  Counselled on the importance of diet, exercise, and its role in overall health and mortality. The patient's FH and SH was reviewed, including their home life,  tobacco status, and drug and alcohol status.  I have personally reviewed the Medicare Annual Wellness questionnaire and have noted 1. The patient's medical and social history 2. Their use of alcohol, tobacco or illicit drugs 3. Their current medications and supplements 4. The patient's functional ability including ADL's, fall risks, home safety risks and hearing or visual             impairment. 5. Diet and physical activities 6. Evidence for depression or mood disorders  The patients weight, height, BMI and visual acuity have been recorded in the chart I have made referrals, counseling and provided education to the patient based review of the above and I have provided the pt with a written personalized care plan for preventive services.  I have provided the patient with a copy of your personalized plan for preventive services. Instructed to take the time to review along with their updated medication list.  Follow-up: No Follow-up on file. Or follow-up in 1 year for complete physical examination  New Prescriptions   No medications on file   Orders Placed This Encounter  Procedures  . Flu Vaccine QUAD 36+ mos PF IM (Fluarix Quad PF)  . Pneumococcal conjugate vaccine 13-valent  . Basic metabolic panel  . CBC with Differential  . Hepatic function panel  . Lipid panel  . TSH    Signed,  Frederico Hamman T. Shirleen Mcfaul, MD   Patient's Medications  New Prescriptions   No medications on file  Previous Medications   ALBUTEROL (PROVENTIL HFA;VENTOLIN HFA) 108 (90 BASE) MCG/ACT INHALER    Inhale 2 puffs into the lungs every 6 (six) hours as needed for wheezing.   AMLODIPINE-ATORVASTATIN (CADUET) 10-20 MG PER TABLET    TAKE 1/2 TABLET BY MOUTH ONCE A DAY   ASPIRIN 81 MG TABLET    Take 81 mg by mouth daily.     BIMATOPROST (LUMIGAN) 0.03 % OPHTHALMIC SOLUTION    Place 1 drop into both eyes at bedtime.    CALCIUM CARBONATE (OS-CAL) 600 MG TABS    Take 600 mg by mouth 2 (two) times daily with a  meal.    CHOLECALCIFEROL (VITAMIN D) 2000 UNITS CAPS    Take by mouth 2 (two) times daily.   DORZOLAMIDE (TRUSOPT) 2 % OPHTHALMIC SOLUTION    Place 1 drop into the right eye 2 (two) times daily.   SPIRIVA HANDIHALER 18 MCG INHALATION CAPSULE    INHALE CONTENTS OF ONE CAPSULE ONCE DAILY   VALSARTAN-HYDROCHLOROTHIAZIDE (DIOVAN-HCT) 160-12.5 MG PER TABLET    TAKE 1 TABLET BY MOUTH ONCE A DAY  Modified Medications   No medications on file  Discontinued Medications   VITAMIN E 400 UNIT CAPSULE    Take 400 Units by mouth daily.

## 2013-12-03 NOTE — Progress Notes (Signed)
Pre visit review using our clinic review tool, if applicable. No additional management support is needed unless otherwise documented below in the visit note. 

## 2013-12-05 ENCOUNTER — Encounter: Payer: Self-pay | Admitting: *Deleted

## 2013-12-22 ENCOUNTER — Other Ambulatory Visit: Payer: Self-pay | Admitting: Family Medicine

## 2014-01-06 ENCOUNTER — Other Ambulatory Visit: Payer: Self-pay | Admitting: Family Medicine

## 2014-04-15 DIAGNOSIS — M3501 Sicca syndrome with keratoconjunctivitis: Secondary | ICD-10-CM | POA: Diagnosis not present

## 2014-06-29 ENCOUNTER — Other Ambulatory Visit: Payer: Self-pay | Admitting: Family Medicine

## 2014-07-06 ENCOUNTER — Other Ambulatory Visit: Payer: Self-pay | Admitting: Family Medicine

## 2014-07-28 DIAGNOSIS — H40003 Preglaucoma, unspecified, bilateral: Secondary | ICD-10-CM | POA: Diagnosis not present

## 2014-07-30 DIAGNOSIS — H40003 Preglaucoma, unspecified, bilateral: Secondary | ICD-10-CM | POA: Diagnosis not present

## 2014-08-21 ENCOUNTER — Other Ambulatory Visit: Payer: Self-pay | Admitting: Family Medicine

## 2015-01-04 ENCOUNTER — Other Ambulatory Visit: Payer: Self-pay | Admitting: Family Medicine

## 2015-01-04 NOTE — Telephone Encounter (Signed)
Please call and schedule CPE with fasting labs prior with Dr. Copland.  

## 2015-01-06 ENCOUNTER — Other Ambulatory Visit: Payer: Self-pay | Admitting: Family Medicine

## 2015-01-06 DIAGNOSIS — R5383 Other fatigue: Secondary | ICD-10-CM

## 2015-01-06 DIAGNOSIS — E785 Hyperlipidemia, unspecified: Secondary | ICD-10-CM

## 2015-01-11 ENCOUNTER — Other Ambulatory Visit (INDEPENDENT_AMBULATORY_CARE_PROVIDER_SITE_OTHER): Payer: Medicare PPO

## 2015-01-11 DIAGNOSIS — Z23 Encounter for immunization: Secondary | ICD-10-CM | POA: Diagnosis not present

## 2015-01-11 DIAGNOSIS — E785 Hyperlipidemia, unspecified: Secondary | ICD-10-CM | POA: Diagnosis not present

## 2015-01-11 DIAGNOSIS — R5383 Other fatigue: Secondary | ICD-10-CM

## 2015-01-11 LAB — HEPATIC FUNCTION PANEL
ALBUMIN: 4.1 g/dL (ref 3.5–5.2)
ALK PHOS: 69 U/L (ref 39–117)
ALT: 17 U/L (ref 0–35)
AST: 18 U/L (ref 0–37)
Bilirubin, Direct: 0.1 mg/dL (ref 0.0–0.3)
TOTAL PROTEIN: 7.3 g/dL (ref 6.0–8.3)
Total Bilirubin: 0.6 mg/dL (ref 0.2–1.2)

## 2015-01-11 LAB — BASIC METABOLIC PANEL
BUN: 11 mg/dL (ref 6–23)
CALCIUM: 10.1 mg/dL (ref 8.4–10.5)
CO2: 27 mEq/L (ref 19–32)
CREATININE: 0.84 mg/dL (ref 0.40–1.20)
Chloride: 100 mEq/L (ref 96–112)
GFR: 69.8 mL/min (ref >60.00)
Glucose, Bld: 105 mg/dL — ABNORMAL HIGH (ref 70–99)
Potassium: 4.4 mEq/L (ref 3.5–5.1)
Sodium: 136 mEq/L (ref 135–145)

## 2015-01-11 LAB — LIPID PANEL
Cholesterol: 170 mg/dL (ref 0–200)
HDL: 86.5 mg/dL (ref >39.00)
LDL CALC: 70 mg/dL (ref 0–99)
NonHDL: 83.05
TRIGLYCERIDES: 64 mg/dL (ref 0.0–149.0)
Total CHOL/HDL Ratio: 2
VLDL: 12.8 mg/dL (ref 0.0–40.0)

## 2015-01-11 LAB — TSH: TSH: 2.29 u[IU]/mL (ref 0.35–4.50)

## 2015-01-12 LAB — CBC WITH DIFFERENTIAL/PLATELET
BASOS PCT: 0.3 % (ref 0.0–3.0)
Basophils Absolute: 0 10*3/uL (ref 0.0–0.1)
EOS ABS: 0.2 10*3/uL (ref 0.0–0.7)
EOS PCT: 3.1 % (ref 0.0–5.0)
HEMATOCRIT: 41 % (ref 36.0–46.0)
HEMOGLOBIN: 13.5 g/dL (ref 12.0–15.0)
LYMPHS PCT: 18.6 % (ref 12.0–46.0)
Lymphs Abs: 1.5 10*3/uL (ref 0.7–4.0)
MCHC: 32.9 g/dL (ref 30.0–36.0)
MCV: 91.5 fl (ref 78.0–100.0)
Monocytes Absolute: 0.7 10*3/uL (ref 0.1–1.0)
Monocytes Relative: 8.4 % (ref 3.0–12.0)
NEUTROS ABS: 5.5 10*3/uL (ref 1.4–7.7)
Neutrophils Relative %: 69.6 % (ref 43.0–77.0)
PLATELETS: 240 10*3/uL (ref 150.0–400.0)
RBC: 4.48 Mil/uL (ref 3.87–5.11)
RDW: 14.1 % (ref 11.5–15.5)
WBC: 7.9 10*3/uL (ref 4.0–10.5)

## 2015-01-18 ENCOUNTER — Ambulatory Visit (INDEPENDENT_AMBULATORY_CARE_PROVIDER_SITE_OTHER): Payer: Medicare PPO | Admitting: Family Medicine

## 2015-01-18 ENCOUNTER — Other Ambulatory Visit: Payer: Self-pay | Admitting: Family Medicine

## 2015-01-18 ENCOUNTER — Encounter: Payer: Self-pay | Admitting: Family Medicine

## 2015-01-18 VITALS — BP 110/60 | HR 71 | Temp 98.6°F | Ht 61.5 in | Wt 165.2 lb

## 2015-01-18 DIAGNOSIS — Z0000 Encounter for general adult medical examination without abnormal findings: Secondary | ICD-10-CM

## 2015-01-18 DIAGNOSIS — Z1231 Encounter for screening mammogram for malignant neoplasm of breast: Secondary | ICD-10-CM

## 2015-01-18 NOTE — Progress Notes (Signed)
Pre visit review using our clinic review tool, if applicable. No additional management support is needed unless otherwise documented below in the visit note. 

## 2015-01-18 NOTE — Progress Notes (Signed)
Dr. Frederico Hamman T. Gilverto Dileonardo, MD, [facility] [place], [address] Phone: [phone] Fax: [phone]  01/18/2015  Patient: Rebecca Hamilton, MRN: [id number], DOB: 09-04-1935, 79 y.o.  Primary Physician:  Owens Loffler, MD   Chief Complaint  Patient presents with  . Annual Exam    Medicare Wellness   Subjective:   Rebecca Hamilton is a 79 y.o. pleasant patient who presents for a medicare wellness examination:  Health Maintenance Summary Reviewed and updated, unless pt declines services.  Tobacco History Reviewed. Non-smoker Alcohol: No concerns, no excessive use Exercise Habits: Some activity, rec at least 30 mins 5 times a week STD concerns: none Drug Use: None Birth control method: n/a Menses regular: n/a Lumps or breast concerns: no Breast Cancer Family History: no  Health Maintenance  Topic Date Due  . MAMMOGRAM  10/17/2014  . INFLUENZA VACCINE  10/12/2015  . TETANUS/TDAP  12/02/2015  . COLONOSCOPY  01/17/2018  . DEXA SCAN  Completed  . ZOSTAVAX  Completed  . PNA vac Low Risk Adult  Completed   Immunization History  Administered Date(s) Administered  . Hepatitis A 06/26/1995, 12/26/1995  . Hepatitis B 12/01/2005, 01/01/2006, 07/06/2006  . Influenza Split 12/29/2011  . Influenza Whole 03/14/2007  . Influenza,inj,Quad PF,36+ Mos 12/03/2013, 01/11/2015  . Influenza-Unspecified 12/11/2012  . Pneumococcal Conjugate-13 12/03/2013  . Pneumococcal Polysaccharide-23 03/13/2005, 12/01/2005  . Td 03/13/2005, 12/01/2005  . Zoster 07/06/2006    Patient Active Problem List   Diagnosis Date Noted  . History of abnormal Pap smear 10/11/2010  . FATIGUE 09/01/2009  . OSTEOPENIA 06/25/2008  . HYPERLIPIDEMIA 05/22/2008  . UNSPECIFIED ANEMIA 05/22/2008  . HYPERTENSION 05/22/2008  . COPD 05/22/2008  . GERD 05/22/2008  . COLONIC POLYPS, HX OF 05/22/2008   Past Medical History  Diagnosis Date  . COPD (chronic  obstructive pulmonary disease) ([place])   . GERD (gastroesophageal reflux disease)   . Osteopenia   . HTN (hypertension)   . Hyperlipidemia   . Colon polyps    No past surgical history on file. Social History   Social History  . Marital Status: Married    Spouse Name: N/A  . Number of Children: 2  . Years of Education: N/A   Occupational History  . retired    Social History Main Topics  . Smoking status: Former Research scientist (life sciences)  . Smokeless tobacco: Never Used     Comment: quit 1988  . Alcohol Use: Yes     Comment: daily  . Drug Use: No  . Sexual Activity: Not on file   Other Topics Concern  . Not on file   Social History Narrative   Regular exercise--no   Family History  Problem Relation Age of Onset  . Diabetes Father   . Diabetes Brother   . Heart disease Mother   . Heart disease Father    Allergies  Allergen Reactions  . Codeine     Medication list has been reviewed and updated.   General: Denies fever, chills, sweats. No significant weight loss. Eyes: Denies blurring,significant itching ENT: Denies earache, sore throat, and hoarseness.  Cardiovascular: Denies chest pains, palpitations, dyspnea on exertion,  Respiratory: Denies cough, dyspnea at rest,wheeezing Breast: no concerns about lumps GI: Denies nausea, vomiting, diarrhea, constipation, change in bowel habits, abdominal pain, melena, hematochezia GU: Denies dysuria, hematuria, urinary hesitancy, nocturia, denies STD risk, no concerns about discharge Musculoskeletal: Denies back pain, joint pain Derm: Denies rash, itching Neuro: Denies  paresthesias,  frequent falls, frequent headaches Psych: Denies depression, anxiety Endocrine: Denies cold intolerance, heat intolerance, polydipsia Heme: Denies enlarged lymph nodes Allergy: No hayfever  Objective:   BP 110/60 mmHg  Pulse 71  Temp(Src) 98.6 F (37 C) (Oral)  Ht 5' 1.5" (1.562 m)  Wt 165 lb 4 oz (74.957 kg)  BMI 30.72 kg/m2  The patient completed  a fall screen and PHQ-2 and PHQ-9 if necessary, which is documented in the EHR. The CMA/LPN/RN who assisted the patient verbally completed with them and documented results in [facility] EHR.  Hearing Screening Comments: Wears Bilateral Hearing Aides Vision Screening Comments: Sees Dr. Karren Burly at [facility].  Has appointment 01/26/2015.  GEN: well developed, well nourished, no acute distress Eyes: conjunctiva and lids normal, PERRLA, EOMI ENT: TM clear, nares clear, oral exam WNL Neck: supple, no lymphadenopathy, no thyromegaly, no JVD Pulm: clear to auscultation and percussion, respiratory effort normal CV: regular rate and rhythm, S1-S2, no murmur, rub or gallop, no bruits Chest: no scars, masses, no lumps BREAST: breast exam normal. Chaperoned examination. Entirety of breast examined including axilla, and no enlarged lymph nodes. No significant masses are felt. No nipple discharge. No skin changes. Overall, reassuring breast exam.  This portion of the physical examination was chaperoned by Hedy Camara, CMA.  GI: soft, non-tender; no hepatosplenomegaly, masses; active bowel sounds all quadrants GU: GU exam declined Lymph: no cervical, axillary or inguinal adenopathy MSK: gait normal, muscle tone and strength WNL, no joint swelling, effusions, discoloration, crepitus  SKIN: clear, good turgor, color WNL, no rashes, lesions, or ulcerations Neuro: normal mental status, normal strength, sensation, and motion Psych: alert; oriented to person, place and time, normally interactive and not anxious or depressed in appearance.   All labs reviewed with patient. Lipids:    Component Value Date/Time   CHOL 170 01/11/2015 0950   TRIG 64.0 01/11/2015 0950   HDL 86.50 01/11/2015 0950   VLDL 12.8 01/11/2015 0950   CHOLHDL 2 01/11/2015 0950   CBC: CBC Latest Ref Rng 01/11/2015 12/03/2013 11/06/2012  WBC 4.0 - 10.5 K/uL 7.9 6.7 5.7  Hemoglobin 12.0 - 15.0 g/dL 13.5 13.9 12.4    Hematocrit 36.0 - 46.0 % 41.0 40.7 36.5  Platelets 150.0 - 400.0 K/uL 240.0 240.0 572.6    Basic Metabolic Panel:    Component Value Date/Time   NA 136 01/11/2015 0950   K 4.4 01/11/2015 0950   CL 100 01/11/2015 0950   CO2 27 01/11/2015 0950   BUN 11 01/11/2015 0950   CREATININE 0.84 01/11/2015 0950   GLUCOSE 105* 01/11/2015 0950   CALCIUM 10.1 01/11/2015 0950   Hepatic Function Latest Ref Rng 01/11/2015 12/03/2013 11/06/2012  Total Protein 6.0 - 8.3 g/dL 7.3 7.4 6.9  Albumin 3.5 - 5.2 g/dL 4.1 4.2 4.0  AST 0 - 37 U/L $Remo'18 28 18  'FaDJw$ ALT 0 - 35 U/L $Remo'17 26 18  'rrCnS$ Alk Phosphatase 39 - 117 U/L 69 59 47  Total Bilirubin 0.2 - 1.2 mg/dL 0.6 0.3 0.8  Bilirubin, Direct 0.0 - 0.3 mg/dL 0.1 0.0 0.1    Lab Results  Component Value Date   TSH 2.29 01/11/2015    No results found.  Assessment and Plan:   Routine general medical examination at a health care facility  Health Maintenance Exam: The patient's preventative maintenance and recommended screening tests for an annual wellness exam were reviewed in full today. Brought up to date unless services declined.  Counselled on the importance of diet, exercise, and  its role in overall health and mortality. The patient's FH and SH was reviewed, including their home life, tobacco status, and drug and alcohol status.  I have personally reviewed the Medicare Annual Wellness questionnaire and have noted 1. The patient's medical and social history 2. Their use of alcohol, tobacco or illicit drugs 3. Their current medications and supplements 4. The patient's functional ability including ADL's, fall risks, home safety risks and hearing or visual             impairment. 5. Diet and physical activities 6. Evidence for depression or mood disorders 7. Reviewed Updated provider list, see scanned forms and [facility] Snapshot.   The patients weight, height, BMI and visual acuity have been recorded in the chart I have made referrals, counseling and provided  education to the patient based review of the above and I have provided the pt with a written personalized care plan for preventive services.  I have provided the patient with a copy of your personalized plan for preventive services. Instructed to take the time to review along with their updated medication list.  Follow-up: No Follow-up on file. Or follow-up in 1 year for complete physical examination  New Prescriptions   No medications on file   Modified Medications   No medications on file   No orders of the defined types were placed in this encounter.    Signed,  Maud Deed. Challen Spainhour, MD   Patient's Medications  New Prescriptions   No medications on file  Previous Medications   ALBUTEROL (PROVENTIL HFA;VENTOLIN HFA) 108 (90 BASE) MCG/ACT INHALER    Inhale 2 puffs into the lungs every 6 (six) hours as needed for wheezing.   AMLODIPINE-ATORVASTATIN (CADUET) 10-20 MG PER TABLET    TAKE 1/2 TABLET BY MOUTH DAILY   ASPIRIN 81 MG TABLET    Take 81 mg by mouth daily.     CALCIUM CARBONATE (OS-CAL) 600 MG TABS    Take 600 mg by mouth 2 (two) times daily with a meal.    CHOLECALCIFEROL (VITAMIN D) 2000 UNITS CAPS    Take by mouth 2 (two) times daily.   DORZOLAMIDE (TRUSOPT) 2 % OPHTHALMIC SOLUTION    Place 1 drop into the right eye 2 (two) times daily.   SPIRIVA HANDIHALER 18 MCG INHALATION CAPSULE    INHALE CONTENTS OF ONE CAPSULE ONCE DAILY   TRAVOPROST, BAK FREE, (TRAVATAN) 0.004 % SOLN OPHTHALMIC SOLUTION    Place 1 drop into both eyes at bedtime.   VALSARTAN-HYDROCHLOROTHIAZIDE (DIOVAN-HCT) 160-12.5 MG TABLET    TAKE 1 TABLET BY MOUTH ONCE A DAY   VITAMIN E PO    Take 1 capsule by mouth daily.  Modified Medications   No medications on file  Discontinued Medications   BIMATOPROST (LUMIGAN) 0.03 % OPHTHALMIC SOLUTION    Place 1 drop into both eyes at bedtime.

## 2015-01-18 NOTE — Patient Instructions (Signed)
  You do not need a referral to make a mammogram appointment, and you may call to make her own mammogram appointment directly around your schedule.  MAMMOGRAPHY IN [place]:  [facility] [phone] [address] [place], [place] [address]  [facility] (Former[facility]) [address] , [place] [address] Phone: [phone] Toll Free: [phone]  MAMMOGRAPHY IN [place]:  [facility] ([place] or [place]) [phone] Located on the campus of [facility])  [facility] [place] ([place] [facility]) [address]  [place], [place] [address]  

## 2015-01-22 ENCOUNTER — Other Ambulatory Visit: Payer: Self-pay | Admitting: Family Medicine

## 2015-01-22 ENCOUNTER — Ambulatory Visit
Admission: RE | Admit: 2015-01-22 | Discharge: 2015-01-22 | Disposition: A | Discharge status: Home / Self Care | Payer: Medicare PPO | Source: Ambulatory Visit | Attending: Family Medicine | Admitting: Family Medicine

## 2015-01-22 DIAGNOSIS — Z1231 Encounter for screening mammogram for malignant neoplasm of breast: Secondary | ICD-10-CM

## 2015-01-26 DIAGNOSIS — H40003 Preglaucoma, unspecified, bilateral: Secondary | ICD-10-CM | POA: Diagnosis not present

## 2015-03-22 ENCOUNTER — Other Ambulatory Visit: Payer: Self-pay | Admitting: *Deleted

## 2015-03-22 MED ORDER — TIOTROPIUM BROMIDE MONOHYDRATE 18 MCG IN CAPS: ORAL_CAPSULE | RESPIRATORY_TRACT | Status: DC

## 2015-03-25 ENCOUNTER — Other Ambulatory Visit: Payer: Self-pay | Admitting: Family Medicine

## 2015-05-05 ENCOUNTER — Other Ambulatory Visit: Payer: Self-pay | Admitting: *Deleted

## 2015-05-05 MED ORDER — AMLODIPINE-ATORVASTATIN 10-20 MG PO TABS
0.5000 | ORAL_TABLET | Freq: Every day | ORAL | Status: DC
Start: 2015-05-05 — End: 2015-08-06

## 2015-05-05 MED ORDER — VALSARTAN-HYDROCHLOROTHIAZIDE 160-12.5 MG PO TABS: ORAL_TABLET | ORAL | Status: DC

## 2015-08-06 ENCOUNTER — Other Ambulatory Visit: Payer: Self-pay | Admitting: *Deleted

## 2015-08-06 MED ORDER — VALSARTAN-HYDROCHLOROTHIAZIDE 160-12.5 MG PO TABS: ORAL_TABLET | ORAL | Status: DC

## 2015-08-06 MED ORDER — AMLODIPINE-ATORVASTATIN 10-20 MG PO TABS: 0.5000 | ORAL_TABLET | Freq: Every day | ORAL | Status: DC

## 2015-12-03 ENCOUNTER — Encounter: Payer: Self-pay | Admitting: Family Medicine

## 2015-12-03 ENCOUNTER — Ambulatory Visit (INDEPENDENT_AMBULATORY_CARE_PROVIDER_SITE_OTHER): Payer: Medicare Other | Admitting: Family Medicine

## 2015-12-03 VITALS — BP 140/78 | HR 73 | Temp 98.4°F | Wt 169.0 lb

## 2015-12-03 DIAGNOSIS — J441 Chronic obstructive pulmonary disease with (acute) exacerbation: Secondary | ICD-10-CM | POA: Diagnosis not present

## 2015-12-03 DIAGNOSIS — J069 Acute upper respiratory infection, unspecified: Secondary | ICD-10-CM

## 2015-12-03 DIAGNOSIS — R059 Cough, unspecified: Secondary | ICD-10-CM

## 2015-12-03 DIAGNOSIS — R05 Cough: Secondary | ICD-10-CM

## 2015-12-03 MED ORDER — AZITHROMYCIN 250 MG PO TABS: ORAL_TABLET | ORAL | 0 refills | Status: DC

## 2015-12-03 MED ORDER — ALBUTEROL SULFATE HFA 108 (90 BASE) MCG/ACT IN AERS: 2.0000 | INHALATION_SPRAY | Freq: Four times a day (QID) | RESPIRATORY_TRACT | 3 refills | Status: DC | PRN

## 2015-12-03 MED ORDER — HYDROCOD POLST-CPM POLST ER 10-8 MG/5ML PO SUER: 5.0000 mL | Freq: Two times a day (BID) | ORAL | 0 refills | Status: DC | PRN

## 2015-12-03 NOTE — Progress Notes (Signed)
Subjective:    Patient ID: Rebecca Hamilton, female    DOB: 02/26/1936, 80 y.o.   MRN: [id number]  HPI This is a 80 yo female who presents today with cough productive of green sputum, nasal congestion with thick green drainage, stopped up ears. [place] in [place] when symptoms started, took OTC cough syrup and aspirin and had some improvement until recently when she began feeling worse. Has well controlled COPD. Has albuterol inhaler at home, has not used recently. Is almost out. Brings an expired bottle of Tussionex in today and requests refill.    Past Medical History:  Diagnosis Date  . Colon polyps   . COPD (chronic obstructive pulmonary disease) (H[facility])   . GERD (gastroesophageal reflux disease)   . HTN (hypertension)   . Hyperlipidemia   . Osteopenia    No past surgical history on file. Family History  Problem Relation Age of Onset  . Diabetes Father   . Diabetes Brother   . Heart disease Mother   . Heart disease Father    Social History  Substance Use Topics  . Smoking status: Former Games developermoker  . Smokeless tobacco: Never Used     Comment: quit 1988  . Alcohol use Yes     Comment: daily     Review of Systems Per HPI    Objective:   Physical Exam  Constitutional: She is oriented to person, place, and time. She appears well-developed and well-nourished. No distress.  HENT:  Head: Normocephalic and atraumatic.  Right Ear: Tympanic membrane, external ear and ear canal normal.  Left Ear: Tympanic membrane, external ear and ear canal normal.  Nose: Mucosal edema and rhinorrhea present.  Mouth/Throat: Uvula is midline and mucous membranes are normal. No posterior oropharyngeal edema or posterior oropharyngeal erythema.  Visible post nasal drainage.   Eyes: Conjunctivae are normal.  Neck: Normal range of motion. Neck supple.  Cardiovascular: Normal rate and regular rhythm.   Pulmonary/Chest: Effort normal and breath sounds normal.  Frequent, dry cough.     Lymphadenopathy:    She has no cervical adenopathy.  Neurological: She is alert and oriented to person, place, and time.  Skin: Skin is warm and dry. She is not diaphoretic.  Psychiatric: She has a normal mood and affect. Her behavior is normal. Judgment and thought content normal.  Vitals reviewed.     BP 140/78   Pulse 73   Temp 98.4 F (36.9 C)   Wt 169 lb (76.7 kg)   SpO2 97%   BMI 31.42 kg/m  Wt Readings from Last 3 Encounters:  12/03/15 169 lb (76.7 kg)  01/18/15 165 lb 4 oz (75 kg)  12/03/13 168 lb 8 oz (76.4 kg)       Assessment & Plan:  1. Acute upper respiratory infection - Increase fluids, rest, symptomatic relief measures - RTC precautions reviewed - azithromycin (ZITHROMAX) 250 MG tablet; Take two tablets today then one a day until finished  Dispense: 6 tablet; Refill: 0  2. Cough - chlorpheniramine-HYDROcodone (TUSSIONEX PENNKINETIC ER) 10-8 MG/5ML SUER; Take 5 mLs by mouth every 12 (twelve) hours as needed for cough.  Dispense: 115 mL; Refill: 0 - albuterol (PROVENTIL HFA;VENTOLIN HFA) 108 (90 Base) MCG/ACT inhaler; Inhale 2 puffs into the lungs every 6 (six) hours as needed for wheezing.  Dispense: 1 Inhaler; Refill: 3  3. COPD exacerbation (HCC) - RTC precautions reviwed   Olean Reeeborah Ashira Kirsten, FNP-BC  [facility], [facility]  12/03/2015 3:49  PM

## 2015-12-13 ENCOUNTER — Other Ambulatory Visit: Payer: Self-pay

## 2015-12-13 ENCOUNTER — Telehealth: Payer: Self-pay

## 2015-12-13 DIAGNOSIS — R059 Cough, unspecified: Secondary | ICD-10-CM

## 2015-12-13 DIAGNOSIS — R05 Cough: Secondary | ICD-10-CM

## 2015-12-13 MED ORDER — HYDROCOD POLST-CPM POLST ER 10-8 MG/5ML PO SUER: 5.0000 mL | Freq: Two times a day (BID) | ORAL | 0 refills | Status: DC | PRN

## 2015-12-13 NOTE — Telephone Encounter (Signed)
Pt states she was seen last week and was prescribed tussinex, she's almost aout of it and she's still congestion. Pt is requesting a refill? Ok to refill?

## 2015-12-13 NOTE — Telephone Encounter (Signed)
Called into [facility] [place] [address] [phone]  

## 2015-12-15 NOTE — Telephone Encounter (Signed)
Left message for Rebecca Hamilton that her prescription is ready to be picked up at the front desk.

## 2015-12-20 ENCOUNTER — Ambulatory Visit (INDEPENDENT_AMBULATORY_CARE_PROVIDER_SITE_OTHER): Payer: Medicare Other | Admitting: Family Medicine

## 2015-12-20 ENCOUNTER — Encounter: Payer: Self-pay | Admitting: Family Medicine

## 2015-12-20 VITALS — BP 110/60 | HR 65 | Temp 98.7°F | Ht 61.5 in | Wt 167.0 lb

## 2015-12-20 DIAGNOSIS — J441 Chronic obstructive pulmonary disease with (acute) exacerbation: Secondary | ICD-10-CM | POA: Diagnosis not present

## 2015-12-20 DIAGNOSIS — J181 Lobar pneumonia, unspecified organism: Secondary | ICD-10-CM

## 2015-12-20 DIAGNOSIS — J189 Pneumonia, unspecified organism: Secondary | ICD-10-CM

## 2015-12-20 MED ORDER — PREDNISONE 20 MG PO TABS: ORAL_TABLET | ORAL | 0 refills | Status: DC

## 2015-12-20 MED ORDER — LEVOFLOXACIN 500 MG PO TABS: 500.0000 mg | ORAL_TABLET | Freq: Every day | ORAL | 0 refills | Status: DC

## 2015-12-20 NOTE — Progress Notes (Signed)
Dr. Karleen HampshireSpencer T. Jameson Morrow, MD, [facility] Primary [facility] Sports Medicine [address] [place], [id number] Phone: [phone] Fax: [phone]  12/20/2015  Patient: Rebecca Hamilton [place], MRN: [id number], DOB: [birth year], 80 y.o.  Primary Physician:  Hannah BeatSpencer Emlyn Maves, MD   Chief Complaint  Patient presents with  . Follow-up    Bronchitis  . Wheezing   Subjective:   Rebecca Hamilton Espino is a 80 y.o. very pleasant female patient who presents with the following:  9/22 - OV and saw Ms. Leone PayorGessner. Finished zpak.   RLL pna and COPD exacerbation Has continued to do worse with persistent wheezing.  Coughing all the time.  Overall, does not feel well. No fever.  Still maintaining PO  Past Medical History, Surgical History, Social History, Family History, Problem List, Medications, and Allergies have been reviewed and updated if relevant.  Patient Active Problem List   Diagnosis Date Noted  . History of abnormal Pap smear 10/11/2010  . FATIGUE 09/01/2009  . OSTEOPENIA 06/25/2008  . HYPERLIPIDEMIA 05/22/2008  . UNSPECIFIED ANEMIA 05/22/2008  . HYPERTENSION 05/22/2008  . COPD 05/22/2008  . GERD 05/22/2008  . COLONIC POLYPS, HX OF 05/22/2008    Past Medical History:  Diagnosis Date  . Colon polyps   . COPD (chronic obstructive pulmonary disease) ([facility])   . GERD (gastroesophageal reflux disease)   . HTN (hypertension)   . Hyperlipidemia   . Osteopenia     No past surgical history on file.  Social History   Social History  . Marital status: Married    Spouse name: N/A  . Number of children: 2  . Years of education: N/A   Occupational History  . retired Retired   Social History Main Topics  . Smoking status: Former Games developermoker  . Smokeless tobacco: Never Used     Comment: quit 1988  . Alcohol use Yes     Comment: daily  . Drug use: No  . Sexual activity: Not on file   Other Topics Concern  . Not on file   Social History Narrative   Regular exercise--no      Family History  Problem Relation Age of Onset  . Diabetes Father   . Diabetes Brother   . Heart disease Mother   . Heart disease Father     Allergies  Allergen Reactions  . Codeine     Medication list reviewed and updated in full in [facility] [facility].  ROS: GEN: Acute illness details above GI: Tolerating PO intake GU: maintaining adequate hydration and urination Pulm: No SOB Interactive and getting along well at home.  Otherwise, ROS is as per the HPI.  Objective:   BP 110/60   Pulse 65   Temp 98.7 F (37.1 C) (Oral)   Ht 5' 1.5" (1.562 m)   Wt 167 lb (75.8 kg)   SpO2 96%   BMI 31.04 kg/m    GEN: A and O x 3. WDWN. NAD.    ENT: Nose clear, ext NML.  No LAD.  No JVD.  TM's clear. Oropharynx clear.  PULM: Normal WOB, no distress. RLL crackles with scattered wheezing throughout CV: RRR, no M/G/R, No rubs, No JVD.   EXT: warm and well-perfused, No c/c/e. PSYCH: Pleasant and conversant.    Laboratory and Imaging Data:  Assessment and Plan:   Pneumonia of right lower lobe due to infectious organism (HCC)  COPD exacerbation (HCC)  Wheezing and COPD flare Change of sx with RLL crackles on exam  Follow-up:  No Follow-up on file.  New Prescriptions   LEVOFLOXACIN (LEVAQUIN) 500 MG TABLET    Take 1 tablet (500 mg total) by mouth daily.   PREDNISONE (DELTASONE) 20 MG TABLET    2 tabs po for 4 days, then 1 tab po for 4 days    Signed,  Caitlynn Ju T. Colbie Sliker, MD   Patient's Medications  New Prescriptions   LEVOFLOXACIN (LEVAQUIN) 500 MG TABLET    Take 1 tablet (500 mg total) by mouth daily.   PREDNISONE (DELTASONE) 20 MG TABLET    2 tabs po for 4 days, then 1 tab po for 4 days  Previous Medications   ALBUTEROL (PROVENTIL HFA;VENTOLIN HFA) 108 (90 BASE) MCG/ACT INHALER    Inhale 2 puffs into the lungs every 6 (six) hours as needed for wheezing.   AMLODIPINE-ATORVASTATIN (CADUET) 10-20 MG TABLET    Take 0.5 tablets by mouth daily.   ASPIRIN 81 MG TABLET     Take 81 mg by mouth daily.     CALCIUM CARBONATE (OS-CAL) 600 MG TABS    Take 600 mg by mouth 2 (two) times daily with a meal.    CHLORPHENIRAMINE-HYDROCODONE (TUSSIONEX PENNKINETIC ER) 10-8 MG/5ML SUER    Take 5 mLs by mouth every 12 (twelve) hours as needed for cough.   CHOLECALCIFEROL (VITAMIN [place]) 2000 UNITS CAPS    Take by mouth 2 (two) times daily.   DORZOLAMIDE (TRUSOPT) 2 % OPHTHALMIC SOLUTION    Place 1 drop into the right eye 2 (two) times daily.   LATANOPROST (XALATAN) 0.005 % OPHTHALMIC SOLUTION       PSEUDOEPHEDRINE-GUAIFENESIN (MUCINEX [place] MAX STRENGTH) [phone] MG TB12    Take 1 tablet by mouth 2 (two) times daily.   TIOTROPIUM (SPIRIVA HANDIHALER) 18 MCG INHALATION CAPSULE    INHALE CONTENTS OF ONE CAPSULE ONCE DAILY   TRAVOPROST, BAK FREE, (TRAVATAN) 0.004 % SOLN OPHTHALMIC SOLUTION    Place 1 drop into both eyes at bedtime.   VALSARTAN-HYDROCHLOROTHIAZIDE (DIOVAN-HCT) 160-12.5 MG TABLET    TAKE 1 TABLET BY MOUTH ONCE ADAY   VITAMIN E PO    Take 1 capsule by mouth daily.  Modified Medications   No medications on file  Discontinued Medications   AZITHROMYCIN (ZITHROMAX) 250 MG TABLET    Take two tablets today then one a day until finished

## 2016-01-18 ENCOUNTER — Encounter: Payer: Self-pay | Admitting: Family Medicine

## 2016-01-18 ENCOUNTER — Ambulatory Visit (INDEPENDENT_AMBULATORY_CARE_PROVIDER_SITE_OTHER): Payer: Medicare Other | Admitting: Family Medicine

## 2016-01-18 VITALS — BP 124/62 | HR 87 | Temp 97.6°F | Wt 162.5 lb

## 2016-01-18 DIAGNOSIS — Z23 Encounter for immunization: Secondary | ICD-10-CM

## 2016-01-18 DIAGNOSIS — J189 Pneumonia, unspecified organism: Secondary | ICD-10-CM

## 2016-01-18 DIAGNOSIS — J441 Chronic obstructive pulmonary disease with (acute) exacerbation: Secondary | ICD-10-CM | POA: Diagnosis not present

## 2016-01-18 NOTE — Progress Notes (Signed)
Pre visit review using our clinic review tool, if applicable. No additional management support is needed unless otherwise documented below in the visit note. 

## 2016-01-18 NOTE — Assessment & Plan Note (Signed)
Lungs clear on exam, O2 sat 100 % on RA. Reassurance provided. Advised she can use albuterol as needed, continue spiriva. Cough can linger. Call or return to clinic prn if these symptoms worsen or fail to improve as anticipated. The patient indicates understanding of these issues and agrees with the plan.

## 2016-01-18 NOTE — Progress Notes (Signed)
Subjective:   Patient ID: Rebecca Hamilton, female    DOB: 08/02/1935, 80 y.o.   MRN: [id number]  Rebecca Hamilton is a pleasant 80 y.o. year old female pt of Dr. Patsy Lageropland, new to me, with h/o COPD who presents to clinic today with Cough and Wheezing  on 01/18/2016  HPI:  Was seen by Deboraha Sprangebbie Gessner on 9/22 for URI symptoms. Note and studies reviewed.  Diagnosed with RLL and COPD exacerbation. Placed on Zpack and given albuterol and tussionex to use prn.  Followed up with PCP on 12/20/15.  Note reviewed. Persistent symptoms- cough and wheeze. RLL crackles on exam. Placed on levaquin and course of prednisone.  Here today because she is still coughing and having some occasional wheezing at night.  Overall, she feels much better.  Still using albuterol daily because she thought she was suppose to use it daily  Uses spiriva daily.   Current Outpatient Prescriptions on File Prior to Visit  Medication Sig Dispense Refill  . albuterol (PROVENTIL HFA;VENTOLIN HFA) 108 (90 Base) MCG/ACT inhaler Inhale 2 puffs into the lungs every 6 (six) hours as needed for wheezing. 1 Inhaler 3  . amlodipine-atorvastatin (CADUET) 10-20 MG tablet Take 0.5 tablets by mouth daily. 45 tablet 1  . aspirin 81 MG tablet Take 81 mg by mouth daily.      . calcium carbonate (OS-CAL) 600 MG TABS Take 600 mg by mouth 2 (two) times daily with a meal.     . Cholecalciferol (VITAMIN D) 2000 UNITS CAPS Take by mouth 2 (two) times daily.    . dorzolamide (TRUSOPT) 2 % ophthalmic solution Place 1 drop into the right eye 2 (two) times daily.    Marland Kitchen. latanoprost (XALATAN) 0.005 % ophthalmic solution     . Pseudoephedrine-Guaifenesin (MUCINEX D MAX STRENGTH) 279 [id number] MG TB12 Take 1 tablet by mouth 2 (two) times daily.    Marland Kitchen. tiotropium (SPIRIVA HANDIHALER) 18 MCG inhalation capsule INHALE CONTENTS OF ONE CAPSULE ONCE DAILY 90 capsule 3  . Travoprost, BAK Free, (TRAVATAN) 0.004 % SOLN ophthalmic solution Place 1 drop into both eyes at  bedtime.    . valsartan-hydrochlorothiazide (DIOVAN-HCT) 160-12.5 MG tablet TAKE 1 TABLET BY MOUTH ONCE ADAY 90 tablet 1  . VITAMIN E PO Take 1 capsule by mouth daily.     No current facility-administered medications on file prior to visit.     Allergies  Allergen Reactions  . Codeine     Past Medical History:  Diagnosis Date  . Colon polyps   . COPD (chronic obstructive pulmonary disease) (HCC)   . GERD (gastroesophageal reflux disease)   . HTN (hypertension)   . Hyperlipidemia   . Osteopenia     No past surgical history on file.  Family History  Problem Relation Age of Onset  . Diabetes Father   . Diabetes Brother   . Heart disease Mother   . Heart disease Father     Social History   Social History  . Marital status: Married    Spouse name: N/A  . Number of children: 2  . Years of education: N/A   Occupational History  . retired Retired   Social History Main Topics  . Smoking status: Former Games developermoker  . Smokeless tobacco: Never Used     Comment: quit 1988  . Alcohol use Yes     Comment: daily  . Drug use: No  . Sexual activity: Not on file   Other Topics Concern  . Not on file  Social History Narrative   Regular exercise--no   The PMH, PSH, Social History, Family History, Medications, and allergies [facility], and have been updated if relevant.    Review of Systems  Constitutional: Negative.   Respiratory: Positive for cough and wheezing. Negative for shortness of breath and stridor.   Cardiovascular: Negative.   Musculoskeletal: Negative.   Neurological: Negative.   All other systems reviewed and are negative.      Objective:    BP 124/62   Pulse 87   Temp 97.6 F (36.4 C) (Oral)   Wt 162 lb 8 oz (73.7 kg)   SpO2 100%   BMI 30.21 kg/m    Physical Exam  Constitutional: She is oriented to person, place, and time. She appears well-developed and well-nourished. No distress.  HENT:  Head: Normocephalic.  Eyes:  Conjunctivae are normal.  Cardiovascular: Normal rate and regular rhythm.   Pulmonary/Chest: Effort normal and breath sounds normal. No respiratory distress. She has no wheezes.  Musculoskeletal: Normal range of motion.  Neurological: She is alert and oriented to person, place, and time. No cranial nerve deficit.  Skin: Skin is warm and dry. She is not diaphoretic.  Psychiatric: She has a normal mood and affect. Her behavior is normal. Judgment and thought content normal.  Nursing note and vitals reviewed.         Assessment & Plan:   Community acquired pneumonia, unspecified laterality  COPD exacerbation (HCC) No Follow-up on file.

## 2016-01-18 NOTE — Addendum Note (Signed)
Addended by: Desmond DikeKNIGHT, Mukhtar Shams H on: 01/18/2016 12:28 PM   Modules accepted: Orders

## 2016-02-01 ENCOUNTER — Other Ambulatory Visit: Payer: Self-pay | Admitting: Family Medicine

## 2016-02-01 NOTE — Telephone Encounter (Signed)
Please call and schedule AMW and CPE with Dr. Patsy Lageropland.

## 2016-02-14 ENCOUNTER — Other Ambulatory Visit: Payer: Self-pay | Admitting: Family Medicine

## 2016-02-14 DIAGNOSIS — Z1231 Encounter for screening mammogram for malignant neoplasm of breast: Secondary | ICD-10-CM

## 2016-02-15 ENCOUNTER — Ambulatory Visit (INDEPENDENT_AMBULATORY_CARE_PROVIDER_SITE_OTHER): Payer: Medicare Other | Admitting: Family Medicine

## 2016-02-15 ENCOUNTER — Encounter: Payer: Self-pay | Admitting: Family Medicine

## 2016-02-15 VITALS — BP 110/74 | HR 73 | Temp 97.8°F | Wt 161.0 lb

## 2016-02-15 DIAGNOSIS — J441 Chronic obstructive pulmonary disease with (acute) exacerbation: Secondary | ICD-10-CM | POA: Diagnosis not present

## 2016-02-15 MED ORDER — HYDROCOD POLST-CPM POLST ER 10-8 MG/5ML PO SUER: 5.0000 mL | Freq: Two times a day (BID) | ORAL | 0 refills | Status: DC | PRN

## 2016-02-15 MED ORDER — PREDNISONE 20 MG PO TABS: 20.0000 mg | ORAL_TABLET | Freq: Every day | ORAL | 0 refills | Status: DC

## 2016-02-15 MED ORDER — DOXYCYCLINE HYCLATE 100 MG PO CAPS: 100.0000 mg | ORAL_CAPSULE | Freq: Two times a day (BID) | ORAL | 0 refills | Status: DC

## 2016-02-15 NOTE — Patient Instructions (Addendum)
Please use your albuterol inhaler every 4- 6 hours for the next two days then as needed Use neti pot regularly If not better in 2-3 days, start prednisone and antibiotic

## 2016-02-15 NOTE — Progress Notes (Signed)
Subjective:    Patient ID: Rebecca Hamilton, female    DOB: 04/12/1935, 80 y.o.   MRN: [id number]  HPI This is a 80 yo female who presents today with two days of worsening cough. Has been treated monthly since 10/17 for pneumonia with azithromycin, levaquin, prednisone. At last visit was doing well but had persistent cough. Lungs were clear. This morning, sputum yellow, nasal drainage without color. No ear pain or pressure. No fever. No wheeze or increased SOB. Used tussionex last night with good sleep. Feels well but has concern given prior pneumonia with prolonged course.   Past Medical History:  Diagnosis Date  . Colon polyps   . COPD (chronic obstructive pulmonary disease) (HCC)   . GERD (gastroesophageal reflux disease)   . HTN (hypertension)   . Hyperlipidemia   . Osteopenia    No past surgical history on file. Family History  Problem Relation Age of Onset  . Diabetes Father   . Diabetes Brother   . Heart disease Mother   . Heart disease Father    Social History  Substance Use Topics  . Smoking status: Former Games developermoker  . Smokeless tobacco: Never Used     Comment: quit 1988  . Alcohol use Yes     Comment: daily      Review of Systems Per HPI    Objective:   Physical Exam  Constitutional: She is oriented to person, place, and time. She appears well-developed and well-nourished. No distress.  HENT:  Head: Normocephalic and atraumatic.  Right Ear: External ear normal.  Left Ear: External ear normal.  Nose: Mucosal edema (right side) present. Right sinus exhibits no maxillary sinus tenderness and no frontal sinus tenderness. Left sinus exhibits no maxillary sinus tenderness.  Mouth/Throat: Uvula is midline and oropharynx is clear and moist.  Eyes: Conjunctivae are normal.  Neck: Normal range of motion. Neck supple.  Cardiovascular: Normal rate, regular rhythm and normal heart sounds.   Pulmonary/Chest: Effort normal and breath sounds normal.  Lymphadenopathy:   She has no cervical adenopathy.  Neurological: She is alert and oriented to person, place, and time.  Skin: Skin is warm and dry. She is not diaphoretic.  Psychiatric: She has a normal mood and affect. Her behavior is normal. Judgment and thought content normal.  Vitals reviewed.     BP 110/74 (BP Location: Right Arm, Patient Position: Sitting, Cuff Size: Normal)   Pulse 73   Temp 97.8 F (36.6 C) (Oral)   Wt 161 lb (73 kg)   SpO2 98%   BMI 29.93 kg/m  Wt Readings from Last 3 Encounters:  02/15/16 161 lb (73 kg)  01/18/16 162 lb 8 oz (73.7 kg)  12/20/15 167 lb (75.8 kg)       Assessment & Plan:  1. COPD exacerbation (HCC) - exam reassuring today, lungs are clear, she looks good. Provided wait and see prescriptions for prednisone and doxy if she is worse in next several days.  - instructed on albuterol use, continue neti pot usage, good fluid intake - RTC precautions reviewed - doxycycline (VIBRAMYCIN) 100 MG capsule; Take 1 capsule (100 mg total) by mouth 2 (two) times daily.  Dispense: 14 capsule; Refill: 0 - predniSONE (DELTASONE) 20 MG tablet; Take 1 tablet (20 mg total) by mouth daily with breakfast.  Dispense: 5 tablet; Refill: 0 - chlorpheniramine-HYDROcodone (TUSSIONEX PENNKINETIC ER) 10-8 MG/5ML SUER; Take 5 mLs by mouth every 12 (twelve) hours as needed for cough.  Dispense: 115 mL; Refill: 0  [name], FNP-BC  [facility], [facility]  02/15/2016 11:03 AM

## 2016-02-15 NOTE — Progress Notes (Signed)
Pre visit review using our clinic review tool, if applicable. No additional management support is needed unless otherwise documented below in the visit note. 

## 2016-03-07 ENCOUNTER — Other Ambulatory Visit (HOSPITAL_COMMUNITY)
Admission: RE | Admit: 2016-03-07 | Discharge: 2016-03-07 | Disposition: A | Discharge status: Home / Self Care | Payer: Medicare Other | Source: Ambulatory Visit | Attending: Family Medicine | Admitting: Family Medicine

## 2016-03-07 ENCOUNTER — Ambulatory Visit (INDEPENDENT_AMBULATORY_CARE_PROVIDER_SITE_OTHER): Payer: Medicare Other | Admitting: Family Medicine

## 2016-03-07 ENCOUNTER — Encounter: Payer: Self-pay | Admitting: Family Medicine

## 2016-03-07 VITALS — BP 116/70 | HR 67 | Temp 97.4°F | Ht 61.75 in | Wt 155.4 lb

## 2016-03-07 DIAGNOSIS — Z01419 Encounter for gynecological examination (general) (routine) without abnormal findings: Secondary | ICD-10-CM | POA: Insufficient documentation

## 2016-03-07 DIAGNOSIS — Z1151 Encounter for screening for human papillomavirus (HPV): Secondary | ICD-10-CM | POA: Insufficient documentation

## 2016-03-07 DIAGNOSIS — Z79899 Other long term (current) drug therapy: Secondary | ICD-10-CM

## 2016-03-07 DIAGNOSIS — R5383 Other fatigue: Secondary | ICD-10-CM

## 2016-03-07 DIAGNOSIS — Z0000 Encounter for general adult medical examination without abnormal findings: Secondary | ICD-10-CM

## 2016-03-07 DIAGNOSIS — Z124 Encounter for screening for malignant neoplasm of cervix: Secondary | ICD-10-CM | POA: Diagnosis not present

## 2016-03-07 DIAGNOSIS — E7800 Pure hypercholesterolemia, unspecified: Secondary | ICD-10-CM | POA: Diagnosis not present

## 2016-03-07 LAB — CBC WITH DIFFERENTIAL/PLATELET
BASOS ABS: 0 10*3/uL (ref 0.0–0.1)
Basophils Relative: 0.7 % (ref 0.0–3.0)
Eosinophils Absolute: 0.3 10*3/uL (ref 0.0–0.7)
Eosinophils Relative: 5.2 % — ABNORMAL HIGH (ref 0.0–5.0)
HCT: 40.4 % (ref 36.0–46.0)
Hemoglobin: 13.7 g/dL (ref 12.0–15.0)
LYMPHS ABS: 1.3 10*3/uL (ref 0.7–4.0)
Lymphocytes Relative: 19.9 % (ref 12.0–46.0)
MCHC: 34 g/dL (ref 30.0–36.0)
MCV: 89.8 fl (ref 78.0–100.0)
MONOS PCT: 8.8 % (ref 3.0–12.0)
Monocytes Absolute: 0.6 10*3/uL (ref 0.1–1.0)
NEUTROS PCT: 65.4 % (ref 43.0–77.0)
Neutro Abs: 4.2 10*3/uL (ref 1.4–7.7)
Platelets: 275 10*3/uL (ref 150.0–400.0)
RBC: 4.5 Mil/uL (ref 3.87–5.11)
RDW: 14.4 % (ref 11.5–15.5)
WBC: 6.4 10*3/uL (ref 4.0–10.5)

## 2016-03-07 LAB — BASIC METABOLIC PANEL
BUN: 12 mg/dL (ref 6–23)
CALCIUM: 10 mg/dL (ref 8.4–10.5)
CO2: 27 mEq/L (ref 19–32)
Chloride: 102 mEq/L (ref 96–112)
Creatinine, Ser: 0.95 mg/dL (ref 0.40–1.20)
GFR: 60.38 mL/min (ref >60.00)
GLUCOSE: 110 mg/dL — AB (ref 70–99)
Potassium: 4.4 mEq/L (ref 3.5–5.1)
SODIUM: 137 meq/L (ref 135–145)

## 2016-03-07 LAB — LIPID PANEL
CHOL/HDL RATIO: 2
Cholesterol: 148 mg/dL (ref 0–200)
HDL: 69 mg/dL (ref >39.00)
LDL CALC: 66 mg/dL (ref 0–99)
NONHDL: 79.22
Triglycerides: 66 mg/dL (ref 0.0–149.0)
VLDL: 13.2 mg/dL (ref 0.0–40.0)

## 2016-03-07 LAB — TSH: TSH: 1.11 u[IU]/mL (ref 0.35–4.50)

## 2016-03-07 LAB — HEPATIC FUNCTION PANEL
ALBUMIN: 4.2 g/dL (ref 3.5–5.2)
ALK PHOS: 61 U/L (ref 39–117)
ALT: 17 U/L (ref 0–35)
AST: 20 U/L (ref 0–37)
BILIRUBIN DIRECT: 0.1 mg/dL (ref 0.0–0.3)
BILIRUBIN TOTAL: 0.6 mg/dL (ref 0.2–1.2)
Total Protein: 7 g/dL (ref 6.0–8.3)

## 2016-03-07 NOTE — Progress Notes (Addendum)
Dr. Frederico Hamman T. Kennedee Kitzmiller, MD, [facility] [place], [address] Phone: [phone] Fax: [phone]  03/07/2016  Patient: Rebecca Hamilton, MRN: [id number], DOB: Dec 10, 1935, 80 y.o.  Primary Physician:  Owens Loffler, MD   Chief Complaint  Patient presents with  . Annual Exam    Medicare Wellness   Subjective:   Rebecca Hamilton is a 80 y.o. pleasant patient who presents for a medicare wellness examination:  Health Maintenance Summary Reviewed and updated, unless pt declines services.  Tobacco History Reviewed. Non-smoker Alcohol: No concerns, no excessive use Exercise Habits: Some activity, rec at least 30 mins 5 times a week STD concerns: none Drug Use: None Birth control method: n/a Menses regular: n/a Lumps or breast concerns: no Breast Cancer Family History: no  Health Maintenance  Topic Date Due  . TETANUS/TDAP  12/02/2015  . MAMMOGRAM  01/22/2016  . COLONOSCOPY  01/17/2018  . INFLUENZA VACCINE  Completed  . DEXA SCAN  Completed  . ZOSTAVAX  Completed  . PNA vac Low Risk Adult  Completed   Immunization History  Administered Date(s) Administered  . Hepatitis A 06/26/1995, 12/26/1995  . Hepatitis B 12/01/2005, 01/01/2006, 07/06/2006  . Influenza Split 12/29/2011  . Influenza Whole 03/14/2007  . Influenza,inj,Quad PF,36+ Mos 12/03/2013, 01/11/2015, 01/18/2016  . Influenza-Unspecified 12/11/2012  . Pneumococcal Conjugate-13 12/03/2013  . Pneumococcal Polysaccharide-23 03/13/2005, 12/01/2005  . Td 03/13/2005, 12/01/2005  . Zoster 07/06/2006    Patient Active Problem List   Diagnosis Date Noted  . History of abnormal Pap smear 10/11/2010  . FATIGUE 09/01/2009  . OSTEOPENIA 06/25/2008  . Hyperlipidemia 05/22/2008  . UNSPECIFIED ANEMIA 05/22/2008  . HYPERTENSION 05/22/2008  . COPD exacerbation ([place]) 05/22/2008  . GERD 05/22/2008  . COLONIC POLYPS, HX OF 05/22/2008   Past Medical History:   Diagnosis Date  . Colon polyps   . COPD (chronic obstructive pulmonary disease) ([place])   . GERD (gastroesophageal reflux disease)   . HTN (hypertension)   . Hyperlipidemia   . Osteopenia    No past surgical history on file. Social History   Social History  . Marital status: Married    Spouse name: N/A  . Number of children: 2  . Years of education: N/A   Occupational History  . retired Retired   Social History Main Topics  . Smoking status: Former Research scientist (life sciences)  . Smokeless tobacco: Never Used     Comment: quit 1988  . Alcohol use Yes     Comment: daily  . Drug use: No  . Sexual activity: Not on file   Other Topics Concern  . Not on file   Social History Narrative   Regular exercise--no   Family History  Problem Relation Age of Onset  . Diabetes Father   . Diabetes Brother   . Heart disease Mother   . Heart disease Father    Allergies  Allergen Reactions  . Codeine     Medication list has been reviewed and updated.   General: Denies fever, chills, sweats. No significant weight loss. Eyes: Denies blurring,significant itching ENT: Denies earache, sore throat, and hoarseness.  Cardiovascular: Denies chest pains, palpitations, dyspnea on exertion,  Respiratory: Denies cough, dyspnea at rest,wheeezing Breast: no concerns about lumps GI: Denies nausea, vomiting, diarrhea, constipation, change in bowel habits, abdominal pain, melena, hematochezia GU: Denies dysuria, hematuria, urinary hesitancy, nocturia, denies STD risk, no concerns about discharge Musculoskeletal: Denies back pain, joint pain Derm: Denies rash, itching Neuro:  Denies  paresthesias, frequent falls, frequent headaches Psych: Denies depression, anxiety Endocrine: Denies cold intolerance, heat intolerance, polydipsia Heme: Denies enlarged lymph nodes Allergy: No hayfever  Objective:   BP 116/70   Pulse 67   Temp 97.4 F (36.3 C) (Oral)   Ht 5' 1.75" (1.568 m)   Wt 155 lb 6.4 oz (70.5 kg)    SpO2 97%   BMI 28.65 kg/m   The patient completed a fall screen and PHQ-2 and PHQ-9 if necessary, which is documented in the EHR. The CMA/LPN/RN who assisted the patient verbally completed with them and documented results in [facility].  Vision Screening Comments: Eye exam done 02/2016  GEN: well developed, well nourished, no acute distress Eyes: conjunctiva and lids normal, PERRLA, EOMI ENT: TM clear, nares clear, oral exam WNL Neck: supple, no lymphadenopathy, no thyromegaly, no JVD Pulm: clear to auscultation and percussion, respiratory effort normal CV: regular rate and rhythm, S1-S2, no murmur, rub or gallop, no bruits Chest: no scars, masses, no lumps BREAST: no lumps, no axillary LAD, no nipple discharge GI: soft, non-tender; no hepatosplenomegaly, masses; active bowel sounds all quadrants GU: Normal external female genitalia. Cervix appears intact without lesions or irritation. Vaginal canal normal without ulceration or lesion. Cervix NT to exam. Ovaries neither enlarged nor tender. (Chaperoned examination by female staff) Lymph: no cervical, axillary or inguinal adenopathy MSK: gait normal, muscle tone and strength WNL, no joint swelling, effusions, discoloration, crepitus  SKIN: clear, good turgor, color WNL, no rashes, lesions, or ulcerations Neuro: normal mental status, normal strength, sensation, and motion Psych: alert; oriented to person, place and time, normally interactive and not anxious or depressed in appearance.  All labs reviewed with patient. Lipids:    Component Value Date/Time   CHOL 170 01/11/2015 0950   TRIG 64.0 01/11/2015 0950   HDL 86.50 01/11/2015 0950   VLDL 12.8 01/11/2015 0950   CHOLHDL 2 01/11/2015 0950   CBC: CBC Latest Ref Rng & Units 01/11/2015 12/03/2013 11/06/2012  WBC 4.0 - 10.5 K/uL 7.9 6.7 5.7  Hemoglobin 12.0 - 15.0 g/dL 13.5 13.9 12.4  Hematocrit 36.0 - 46.0 % 41.0 40.7 36.5  Platelets 150.0 - 400.0 K/uL 240.0 240.0 213.0     Basic Metabolic Panel:    Component Value Date/Time   NA 136 01/11/2015 0950   K 4.4 01/11/2015 0950   CL 100 01/11/2015 0950   CO2 27 01/11/2015 0950   BUN 11 01/11/2015 0950   CREATININE 0.84 01/11/2015 0950   GLUCOSE 105 (H) 01/11/2015 0950   CALCIUM 10.1 01/11/2015 0950   Hepatic Function Latest Ref Rng & Units 01/11/2015 12/03/2013 11/06/2012  Total Protein 6.0 - 8.3 g/dL 7.3 7.4 6.9  Albumin 3.5 - 5.2 g/dL 4.1 4.2 4.0  AST 0 - 37 U/L '18 28 18  '$ ALT 0 - 35 U/L '17 26 18  '$ Alk Phosphatase 39 - 117 U/L 69 59 47  Total Bilirubin 0.2 - 1.2 mg/dL 0.6 0.3 0.8  Bilirubin, Direct 0.0 - 0.3 mg/dL 0.1 0.0 0.1    Lab Results  Component Value Date   TSH 2.29 01/11/2015    No results found.  Assessment and Plan:   Routine general medical examination at a health care facility  Pure hypercholesterolemia - Plan: Lipid panel  Encounter for long-term current use of medication - Plan: Basic metabolic panel, CBC with Differential/Platelet, Hepatic function panel  Other fatigue - Plan: TSH  Screening for cervical cancer - Plan: Cytology - PAP  Health Maintenance  Exam: The patient's preventative maintenance and recommended screening tests for an annual wellness exam were reviewed in full today. Brought up to date unless services declined.  Counselled on the importance of diet, exercise, and its role in overall health and mortality. The patient's FH and SH was reviewed, including their home life, tobacco status, and drug and alcohol status.  Follow-up in 1 year for physical exam or additional follow-up below.  I have personally reviewed the Medicare Annual Wellness questionnaire and have noted 1. The patient's medical and social history 2. Their use of alcohol, tobacco or illicit drugs 3. Their current medications and supplements 4. The patient's functional ability including ADL's, fall risks, home safety risks and hearing or visual             impairment. 5. Diet and physical  activities 6. Evidence for depression or mood disorders 7. Reviewed Updated provider list, see scanned forms and [facility] Snapshot.   The patients weight, height, BMI and visual acuity have been recorded in the chart I have made referrals, counseling and provided education to the patient based review of the above and I have provided the pt with a written personalized care plan for preventive services.  I have provided the patient with a copy of your personalized plan for preventive services. Instructed to take the time to review along with their updated medication list.  Follow-up: No Follow-up on file.  Meds ordered this encounter  Medications  . Glucosamine-Chondroitin (GLUCOSAMINE CHONDR COMPLEX PO)    Sig: Take 1 tablet by mouth daily.   Medications Discontinued During This Encounter  Medication Reason  . chlorpheniramine-HYDROcodone (TUSSIONEX PENNKINETIC ER) 10-8 MG/5ML SUER Completed Course  . Travoprost, BAK Free, (TRAVATAN) 0.004 % SOLN ophthalmic solution No longer needed (for PRN medications)   Orders Placed This Encounter  Procedures  . Basic metabolic panel  . CBC with Differential/Platelet  . Hepatic function panel  . TSH  . Lipid panel    Signed,  Frederico Hamman T. Random Dobrowski, MD   Allergies as of 03/07/2016      Reactions   Codeine       Medication List       Accurate as of 03/07/16  1:18 PM. Always use your most recent med list.          albuterol 108 (90 Base) MCG/ACT inhaler Commonly known as:  PROVENTIL HFA;VENTOLIN HFA Inhale 2 puffs into the lungs every 6 (six) hours as needed for wheezing.   amlodipine-atorvastatin 10-20 MG tablet Commonly known as:  CADUET TAKE 1/2 TABLETS BY MOUTH DAILY   aspirin 81 MG tablet Take 81 mg by mouth daily.   calcium carbonate 600 MG Tabs tablet Commonly known as:  OS-CAL Take 600 mg by mouth 2 (two) times daily with a meal.   dorzolamide 2 % ophthalmic solution Commonly known as:  TRUSOPT Place 1 drop into the  right eye 2 (two) times daily.   doxycycline 100 MG capsule Commonly known as:  VIBRAMYCIN Take 1 capsule (100 mg total) by mouth 2 (two) times daily.   GLUCOSAMINE CHONDR COMPLEX PO Take 1 tablet by mouth daily.   latanoprost 0.005 % ophthalmic solution Commonly known as:  XALATAN   MUCINEX D MAX STRENGTH [id number] MG Tb12 Generic drug:  Pseudoephedrine-Guaifenesin Take 1 tablet by mouth 2 (two) times daily.   predniSONE 20 MG tablet Commonly known as:  DELTASONE Take 1 tablet (20 mg total) by mouth daily with breakfast.   tiotropium 18 MCG inhalation capsule Commonly known  as:  SPIRIVA HANDIHALER INHALE CONTENTS OF ONE CAPSULE ONCE DAILY   valsartan-hydrochlorothiazide 160-12.5 MG tablet Commonly known as:  DIOVAN-HCT TAKE 1 TABLET BY MOUTH ONCE ADAY   Vitamin D 2000 units Caps Take by mouth 2 (two) times daily.   VITAMIN E PO Take 1 capsule by mouth daily.

## 2016-03-07 NOTE — Progress Notes (Signed)
Pre visit review using our clinic review tool, if applicable. No additional management support is needed unless otherwise documented below in the visit note. 

## 2016-03-08 LAB — CYTOLOGY - PAP
DIAGNOSIS: NEGATIVE
HPV: NOT DETECTED

## 2016-03-27 ENCOUNTER — Ambulatory Visit
Admission: RE | Admit: 2016-03-27 | Discharge: 2016-03-27 | Disposition: A | Discharge status: Home / Self Care | Payer: Medicare Other | Source: Ambulatory Visit | Attending: Family Medicine | Admitting: Family Medicine

## 2016-03-27 DIAGNOSIS — Z1231 Encounter for screening mammogram for malignant neoplasm of breast: Secondary | ICD-10-CM | POA: Diagnosis present

## 2016-04-20 ENCOUNTER — Other Ambulatory Visit: Payer: Self-pay | Admitting: Family Medicine

## 2016-05-12 ENCOUNTER — Other Ambulatory Visit: Payer: Self-pay | Admitting: *Deleted

## 2016-05-12 MED ORDER — AMLODIPINE-ATORVASTATIN 10-20 MG PO TABS: ORAL_TABLET | ORAL | 2 refills | Status: DC

## 2016-06-12 ENCOUNTER — Other Ambulatory Visit: Payer: Self-pay | Admitting: *Deleted

## 2016-06-12 MED ORDER — VALSARTAN-HYDROCHLOROTHIAZIDE 160-12.5 MG PO TABS: ORAL_TABLET | ORAL | 1 refills | Status: DC

## 2016-06-22 ENCOUNTER — Ambulatory Visit: Payer: Medicare Other | Admitting: Family Medicine

## 2016-09-28 NOTE — Telephone Encounter (Signed)
Error

## 2016-10-17 ENCOUNTER — Telehealth: Payer: Self-pay

## 2016-10-17 NOTE — Telephone Encounter (Signed)
Thayer Ohmhris with [facility] left v/m that the valsartan-HCTZ was recalled and request cb with substitute med. Chris request cb.

## 2016-10-18 ENCOUNTER — Other Ambulatory Visit: Payer: Self-pay | Admitting: Family Medicine

## 2016-10-18 MED ORDER — LOSARTAN POTASSIUM-HCTZ 50-12.5 MG PO TABS: 1.0000 | ORAL_TABLET | Freq: Every day | ORAL | 3 refills | Status: DC

## 2016-10-18 NOTE — Telephone Encounter (Signed)
Ok to change to losartan / hctz 50/12.5 mg. 1 po daily, #90, 3 ref

## 2016-10-18 NOTE — Telephone Encounter (Signed)
Rx sent as instructed by Dr. Patsy Lageropland to [facility].  Ms. Rebecca Hamilton notified by telephone of change in her medication.

## 2016-10-30 ENCOUNTER — Telehealth: Payer: Self-pay | Admitting: Family Medicine

## 2016-10-30 NOTE — Telephone Encounter (Signed)
Pt's spouse dropped off form to be signed. Placed in RX tower

## 2016-10-30 NOTE — Telephone Encounter (Signed)
Patient's husband called and said to disregard the form.  [facility] doesn't need it. I took it out of Dr.Copland's box and shredded it.

## 2016-10-30 NOTE — Telephone Encounter (Signed)
Form placed in Dr. Copland's in box for signature. 

## 2017-01-08 ENCOUNTER — Telehealth: Payer: Self-pay | Admitting: Family Medicine

## 2017-01-08 ENCOUNTER — Ambulatory Visit (INDEPENDENT_AMBULATORY_CARE_PROVIDER_SITE_OTHER): Payer: Medicare Other | Admitting: Family Medicine

## 2017-01-08 ENCOUNTER — Encounter: Payer: Self-pay | Admitting: Family Medicine

## 2017-01-08 VITALS — BP 122/70 | HR 76 | Temp 98.2°F | Wt 154.5 lb

## 2017-01-08 DIAGNOSIS — R05 Cough: Secondary | ICD-10-CM | POA: Diagnosis not present

## 2017-01-08 DIAGNOSIS — R059 Cough, unspecified: Secondary | ICD-10-CM

## 2017-01-08 DIAGNOSIS — J069 Acute upper respiratory infection, unspecified: Secondary | ICD-10-CM | POA: Diagnosis not present

## 2017-01-08 MED ORDER — HYDROCOD POLST-CPM POLST ER 10-8 MG/5ML PO SUER: 5.0000 mL | Freq: Two times a day (BID) | ORAL | 0 refills | Status: DC | PRN

## 2017-01-08 MED ORDER — PREDNISONE 20 MG PO TABS: ORAL_TABLET | ORAL | 0 refills | Status: DC

## 2017-01-08 NOTE — Telephone Encounter (Signed)
Spoke with pt, confirms she did receive printed rx and apologizes for the confusion.

## 2017-01-08 NOTE — Telephone Encounter (Signed)
I did give it to patient - plz have her check her paperwork

## 2017-01-08 NOTE — Progress Notes (Signed)
BP 122/70 (BP Location: Left Arm, Patient Position: Sitting, Cuff Size: Normal)   Pulse 76   Temp 98.2 F (36.8 C) (Oral)   Wt 154 lb 8 oz (70.1 kg)   SpO2 97%   BMI 28.49 kg/m    CC: cough and sinus problems Subjective:    Patient ID: Lesle Chris, female    DOB: May 15, 1935, 81 y.o.   MRN: [id number]  HPI: ELIORA NIENHUIS is a 81 y.o. female presenting on 01/08/2017 for Cough (Sxs started this past weekend. Has taken Advil. H/o bronchitis) and Sinus Problem (Achy around eyes)   3d h/o malaise, fatigued, nasal congestion, sinus drainage, today with worsening cough. Now chest discomfort with coughing. Mostly dry cough. Some chest congestion < head congestion. + sore throat and PNdrainage. Increased cough but no sputum production, no dyspnea.   No fevers/chills, ear or tooth pain. No headaches. Denies significant wheezing.   Known h/o COPD - on spiriva daily and albuterol PRN.  Last fall she battled bronchitis for several months.  No sick contacts at home.  No smokers at home.   Relevant past medical, surgical, family and social history reviewed and updated as indicated. Interim medical history since our last visit reviewed. Allergies and medications reviewed and updated. Outpatient Medications Prior to Visit  Medication Sig Dispense Refill  . albuterol (PROVENTIL HFA;VENTOLIN HFA) 108 (90 Base) MCG/ACT inhaler Inhale 2 puffs into the lungs every 6 (six) hours as needed for wheezing. 1 Inhaler 3  . amlodipine-atorvastatin (CADUET) 10-20 MG tablet TAKE 1/2 TABLETS BY MOUTH DAILY 45 tablet 2  . aspirin 81 MG tablet Take 81 mg by mouth daily.      . calcium carbonate (OS-CAL) 600 MG TABS Take 600 mg by mouth 2 (two) times daily with a meal.     . Cholecalciferol (VITAMIN D) 2000 UNITS CAPS Take by mouth 2 (two) times daily.    . dorzolamide (TRUSOPT) 2 % ophthalmic solution Place 1 drop into the right eye 2 (two) times daily.    . Glucosamine-Chondroitin (GLUCOSAMINE CHONDR  COMPLEX PO) Take 1 tablet by mouth daily.    Marland Kitchen latanoprost (XALATAN) 0.005 % ophthalmic solution     . losartan-hydrochlorothiazide (HYZAAR) 50-12.5 MG tablet Take 1 tablet by mouth daily. 90 tablet 3  . SPIRIVA HANDIHALER 18 MCG inhalation capsule INHALE CONTENTS OF ONE CAPSULE ONCE DAILY 90 capsule 1  . VITAMIN E PO Take 1 capsule by mouth daily.    . Pseudoephedrine-Guaifenesin (MUCINEX D MAX STRENGTH) [phone] MG TB12 Take 1 tablet by mouth 2 (two) times daily.     No facility-administered medications prior to visit.      Per HPI unless specifically indicated in ROS section below Review of Systems     Objective:    BP 122/70 (BP Location: Left Arm, Patient Position: Sitting, Cuff Size: Normal)   Pulse 76   Temp 98.2 F (36.8 C) (Oral)   Wt 154 lb 8 oz (70.1 kg)   SpO2 97%   BMI 28.49 kg/m   Wt Readings from Last 3 Encounters:  01/08/17 154 lb 8 oz (70.1 kg)  03/07/16 155 lb 6.4 oz (70.5 kg)  02/15/16 161 lb (73 kg)    Physical Exam  Constitutional: She appears well-developed and well-nourished. No distress.  HENT:  Head: Normocephalic and atraumatic.  Right Ear: Hearing, tympanic membrane, external ear and ear canal normal.  Left Ear: Hearing, tympanic membrane, external ear and ear canal normal.  Nose: No mucosal edema or  rhinorrhea. Right sinus exhibits maxillary sinus tenderness. Right sinus exhibits no frontal sinus tenderness. Left sinus exhibits maxillary sinus tenderness. Left sinus exhibits no frontal sinus tenderness.  Mouth/Throat: Uvula is midline, oropharynx is clear and moist and mucous membranes are normal. No oropharyngeal exudate, posterior oropharyngeal edema, posterior oropharyngeal erythema or tonsillar abscesses.  Dry nasal mucosa  Eyes: Pupils are equal, round, and reactive to light. Conjunctivae and EOM are normal. No scleral icterus.  Neck: Normal range of motion. Neck supple.  Cardiovascular: Normal rate, regular rhythm, normal heart sounds and  intact distal pulses.   No murmur heard. Pulmonary/Chest: Effort normal and breath sounds normal. No respiratory distress. She has no decreased breath sounds. She has no wheezes. She has no rhonchi. She has no rales.  Dry cough present Lungs clear  Lymphadenopathy:    She has no cervical adenopathy.  Skin: Skin is warm and dry. No rash noted.  Nursing note and vitals reviewed.     Assessment & Plan:   Problem List Items Addressed This Visit    URI with cough and congestion - Primary    Anticipate viral given short duration and lack of fever, overall well appearing. Supportive care reviewed. Will rec schedule albuterol over next 3 days. Given COPD hx, treat with prednisone taper as well. Red flags to seek further care or update us for further treatment reviewed.        Other Visit Diagnoses    Cough       Relevant Medications   chlorpheniramine-HYDROcodone (TUSSIONEX PENNKINETIC ER) 10-8 MG/5ML SUER       Follow up plan: Return if symptoms worsen or fail to improve.  Eustaquio BoydenJavier Guillaume Weninger, MD

## 2017-01-08 NOTE — Assessment & Plan Note (Signed)
Anticipate viral given short duration and lack of fever, overall well appearing. Supportive care reviewed. Will rec schedule albuterol over next 3 days. Given COPD hx, treat with prednisone taper as well. Red flags to seek further care or update us for further treatment reviewed.

## 2017-01-08 NOTE — Telephone Encounter (Signed)
Copied from CRM #[id number]. Topic: Quick Communication - See Telephone Encounter >> Jan 08, 2017  3:21 PM Landry MellowFoltz, Melissa J wrote: CRM for notification. See Telephone encounter for:  01/08/17.pt did not receive her printed rx for tussinex. Pt is on way back to office to pick up printed rx.

## 2017-01-08 NOTE — Patient Instructions (Addendum)
I think you have upper respiratory infection, likely viral. Push fluids and rest. Be more regular with albuterol over the next 3-5 days then try to back off. Take prednisone taper sent to pharmacy. May use tussionex penkinetic for cough.  If fever >101, worsening productive cough, or not improving as expected, let us know.

## 2017-01-08 NOTE — Telephone Encounter (Signed)
Noted. Fwd to Dr. G. 

## 2017-01-08 NOTE — Telephone Encounter (Signed)
Please be aware-patient returning to office

## 2017-02-14 ENCOUNTER — Other Ambulatory Visit: Payer: Self-pay | Admitting: *Deleted

## 2017-02-14 MED ORDER — AMLODIPINE-ATORVASTATIN 10-20 MG PO TABS: ORAL_TABLET | ORAL | 0 refills | Status: DC

## 2017-03-15 ENCOUNTER — Other Ambulatory Visit: Payer: Self-pay | Admitting: Family Medicine

## 2017-03-15 DIAGNOSIS — Z1231 Encounter for screening mammogram for malignant neoplasm of breast: Secondary | ICD-10-CM

## 2017-03-28 ENCOUNTER — Other Ambulatory Visit: Payer: Self-pay | Admitting: *Deleted

## 2017-03-28 MED ORDER — AMLODIPINE-ATORVASTATIN 10-20 MG PO TABS: ORAL_TABLET | ORAL | 0 refills | Status: DC

## 2017-04-02 ENCOUNTER — Other Ambulatory Visit (INDEPENDENT_AMBULATORY_CARE_PROVIDER_SITE_OTHER): Payer: Medicare Other

## 2017-04-02 DIAGNOSIS — E785 Hyperlipidemia, unspecified: Secondary | ICD-10-CM | POA: Diagnosis not present

## 2017-04-02 DIAGNOSIS — E559 Vitamin D deficiency, unspecified: Secondary | ICD-10-CM | POA: Diagnosis not present

## 2017-04-02 DIAGNOSIS — R5383 Other fatigue: Secondary | ICD-10-CM | POA: Diagnosis not present

## 2017-04-02 LAB — CBC WITH DIFFERENTIAL/PLATELET
Basophils Absolute: 0 10*3/uL (ref 0.0–0.1)
Basophils Relative: 0.9 % (ref 0.0–3.0)
Eosinophils Absolute: 0.3 10*3/uL (ref 0.0–0.7)
Eosinophils Relative: 5.3 % — ABNORMAL HIGH (ref 0.0–5.0)
HEMATOCRIT: 40.3 % (ref 36.0–46.0)
HEMOGLOBIN: 13.2 g/dL (ref 12.0–15.0)
LYMPHS PCT: 22.9 % (ref 12.0–46.0)
Lymphs Abs: 1.2 10*3/uL (ref 0.7–4.0)
MCHC: 32.8 g/dL (ref 30.0–36.0)
MCV: 86.3 fl (ref 78.0–100.0)
MONOS PCT: 8.9 % (ref 3.0–12.0)
Monocytes Absolute: 0.5 10*3/uL (ref 0.1–1.0)
NEUTROS ABS: 3.3 10*3/uL (ref 1.4–7.7)
Neutrophils Relative %: 62 % (ref 43.0–77.0)
PLATELETS: 250 10*3/uL (ref 150.0–400.0)
RBC: 4.67 Mil/uL (ref 3.87–5.11)
RDW: 15.4 % (ref 11.5–15.5)
WBC: 5.4 10*3/uL (ref 4.0–10.5)

## 2017-04-02 LAB — BASIC METABOLIC PANEL
BUN: 14 mg/dL (ref 6–23)
CALCIUM: 9.8 mg/dL (ref 8.4–10.5)
CO2: 29 meq/L (ref 19–32)
Chloride: 101 mEq/L (ref 96–112)
Creatinine, Ser: 0.9 mg/dL (ref 0.40–1.20)
GFR: 64.09 mL/min (ref >60.00)
Glucose, Bld: 113 mg/dL — ABNORMAL HIGH (ref 70–99)
Potassium: 4 mEq/L (ref 3.5–5.1)
SODIUM: 136 meq/L (ref 135–145)

## 2017-04-02 LAB — HEPATIC FUNCTION PANEL
ALBUMIN: 3.9 g/dL (ref 3.5–5.2)
ALT: 18 U/L (ref 0–35)
AST: 18 U/L (ref 0–37)
Alkaline Phosphatase: 58 U/L (ref 39–117)
Bilirubin, Direct: 0.1 mg/dL (ref 0.0–0.3)
Total Bilirubin: 0.6 mg/dL (ref 0.2–1.2)
Total Protein: 6.6 g/dL (ref 6.0–8.3)

## 2017-04-02 LAB — LIPID PANEL
CHOL/HDL RATIO: 2
Cholesterol: 150 mg/dL (ref 0–200)
HDL: 68.2 mg/dL (ref >39.00)
LDL Cholesterol: 71 mg/dL (ref 0–99)
NONHDL: 81.71
Triglycerides: 56 mg/dL (ref 0.0–149.0)
VLDL: 11.2 mg/dL (ref 0.0–40.0)

## 2017-04-02 LAB — TSH: TSH: 3.53 u[IU]/mL (ref 0.35–4.50)

## 2017-04-02 LAB — VITAMIN D 25 HYDROXY (VIT D DEFICIENCY, FRACTURES): VITD: 47.87 ng/mL (ref 30.00–100.00)

## 2017-04-02 NOTE — Addendum Note (Signed)
Addended by: Alvina ChouWALSH, Alexsandria Kivett J on: 04/02/2017 09:19 AM   Modules accepted: Orders

## 2017-04-09 ENCOUNTER — Other Ambulatory Visit: Payer: Self-pay

## 2017-04-09 ENCOUNTER — Encounter: Payer: Self-pay | Admitting: Family Medicine

## 2017-04-09 ENCOUNTER — Ambulatory Visit (INDEPENDENT_AMBULATORY_CARE_PROVIDER_SITE_OTHER): Payer: Medicare Other | Admitting: Family Medicine

## 2017-04-09 VITALS — BP 110/60 | HR 78 | Temp 99.0°F | Ht 61.5 in | Wt 151.5 lb

## 2017-04-09 DIAGNOSIS — Z0000 Encounter for general adult medical examination without abnormal findings: Secondary | ICD-10-CM

## 2017-04-09 NOTE — Progress Notes (Signed)
Dr. Frederico Hamman T. Adja Ruff, MD, [facility] [place], [address] Phone: [phone] Fax: [phone]  04/09/2017  Patient: Rebecca Hamilton, MRN: [id number], DOB: March 29, 1935, 82 y.o.  Primary Physician:  Owens Loffler, MD   Chief Complaint  Patient presents with  . Medicare Wellness   Subjective:   Rebecca Hamilton is a 82 y.o. pleasant patient who presents for a medicare wellness examination:  Health Maintenance Summary Reviewed and updated, unless pt declines services.  Tobacco History Reviewed. Non-smoker Alcohol: No concerns, no excessive use Exercise Habits: gym about 5 days a week STD concerns: none Drug Use: None Birth control method: n/a Menses regular: n/a Lumps or breast concerns: no Breast Cancer Family History: no  Globally, she is still doing well.   Health Maintenance  Topic Date Due  . TETANUS/TDAP  12/02/2015  . MAMMOGRAM  03/27/2017  . COLONOSCOPY  01/17/2018  . INFLUENZA VACCINE  Completed  . DEXA SCAN  Completed  . PNA vac Low Risk Adult  Completed   Immunization History  Administered Date(s) Administered  . Hepatitis A 06/26/1995, 12/26/1995  . Hepatitis B 12/01/2005, 01/01/2006, 07/06/2006  . Influenza Split 12/29/2011  . Influenza Whole 03/14/2007  . Influenza,inj,Quad PF,6+ Mos 12/03/2013, 01/11/2015, 01/18/2016, 01/04/2017  . Influenza-Unspecified 12/11/2012  . Pneumococcal Conjugate-13 12/03/2013  . Pneumococcal Polysaccharide-23 03/13/2005, 12/01/2005  . Td 03/13/2005, 12/01/2005  . Zoster 07/06/2006    Patient Active Problem List   Diagnosis Date Noted  . History of abnormal Pap smear 10/11/2010  . FATIGUE 09/01/2009  . OSTEOPENIA 06/25/2008  . Hyperlipidemia 05/22/2008  . UNSPECIFIED ANEMIA 05/22/2008  . HYPERTENSION 05/22/2008  . COPD exacerbation ([place]) 05/22/2008  . GERD 05/22/2008  . COLONIC POLYPS, HX OF 05/22/2008   Past Medical History:  Diagnosis Date    . Colon polyps   . COPD (chronic obstructive pulmonary disease) ([place])   . GERD (gastroesophageal reflux disease)   . HTN (hypertension)   . Hyperlipidemia   . Osteopenia    History reviewed. No pertinent surgical history. Social History   Socioeconomic History  . Marital status: Married    Spouse name: Not on file  . Number of children: 2  . Years of education: Not on file  . Highest education level: Not on file  Social Needs  . Financial resource strain: Not on file  . Food insecurity - worry: Not on file  . Food insecurity - inability: Not on file  . Transportation needs - medical: Not on file  . Transportation needs - non-medical: Not on file  Occupational History  . Occupation: retired    Fish farm manager: retired  Tobacco Use  . Smoking status: Former Research scientist (life sciences)  . Smokeless tobacco: Never Used  . Tobacco comment: quit 1988  Substance and Sexual Activity  . Alcohol use: Yes    Comment: daily  . Drug use: No  . Sexual activity: Not on file  Other Topics Concern  . Not on file  Social History Narrative   Regular exercise--no   Family History  Problem Relation Age of Onset  . Diabetes Father   . Heart disease Father   . Diabetes Brother   . Heart disease Mother   . Breast cancer Neg Hx    Allergies  Allergen Reactions  . Codeine Nausea Only    Medication list has been reviewed and updated.   General: Denies fever, chills, sweats. No significant weight loss. Eyes: Denies blurring,significant itching ENT: Denies earache,  sore throat, and hoarseness.  Cardiovascular: Denies chest pains, palpitations, dyspnea on exertion,  Respiratory: Denies cough, dyspnea at rest,wheeezing Breast: no concerns about lumps GI: Denies nausea, vomiting, diarrhea, constipation, change in bowel habits, abdominal pain, melena, hematochezia GU: Denies dysuria, hematuria, urinary hesitancy, nocturia, denies STD risk, no concerns about discharge Musculoskeletal: Denies back pain, joint  pain Derm: Denies rash, itching Neuro: Denies  paresthesias, frequent falls, frequent headaches Psych: Denies depression, anxiety Endocrine: Denies cold intolerance, heat intolerance, polydipsia Heme: Denies enlarged lymph nodes Allergy: No hayfever  Objective:   BP 110/60   Pulse 78   Temp 99 F (37.2 C) (Oral)   Ht 5' 1.5" (1.562 m)   Wt 151 lb 8 oz (68.7 kg)   BMI 28.16 kg/m   The patient completed a fall screen and PHQ-2 and PHQ-9 if necessary, which is documented in the EHR. The CMA/LPN/RN who assisted the patient verbally completed with them and documented results in [facility] EHR.  Hearing Screening Comments: Wears Bilateral Hearing Aides Vision Screening Comments: Wears Glasses- Eye Exam with Dr. George Ina at [facility] 02/2017  GEN: well developed, well nourished, no acute distress Eyes: conjunctiva and lids normal, PERRLA, EOMI ENT: TM clear, nares clear, oral exam WNL Neck: supple, no lymphadenopathy, no thyromegaly, no JVD Pulm: clear to auscultation and percussion, respiratory effort normal CV: regular rate and rhythm, S1-S2, no murmur, rub or gallop, no bruits Chest: no scars, masses, no lumps BREAST: breast exam declined GI: soft, non-tender; no hepatosplenomegaly, masses; active bowel sounds all quadrants GU: GU exam declined Lymph: no cervical, axillary or inguinal adenopathy MSK: gait normal, muscle tone and strength WNL, no joint swelling, effusions, discoloration, crepitus  SKIN: clear, good turgor, color WNL, no rashes, lesions, or ulcerations Neuro: normal mental status, normal strength, sensation, and motion Psych: alert; oriented to person, place and time, normally interactive and not anxious or depressed in appearance.   All labs reviewed with patient. Lipids:    Component Value Date/Time   CHOL 150 04/02/2017 0919   TRIG 56.0 04/02/2017 0919   HDL 68.20 04/02/2017 0919   VLDL 11.2 04/02/2017 0919   CHOLHDL 2 04/02/2017 0919    CBC: CBC Latest Ref Rng & Units 04/02/2017 03/07/2016 01/11/2015  WBC 4.0 - 10.5 K/uL 5.4 6.4 7.9  Hemoglobin 12.0 - 15.0 g/dL 13.2 13.7 13.5  Hematocrit 36.0 - 46.0 % 40.3 40.4 41.0  Platelets 150.0 - 400.0 K/uL 250.0 275.0 474.2    Basic Metabolic Panel:    Component Value Date/Time   NA 136 04/02/2017 0919   K 4.0 04/02/2017 0919   CL 101 04/02/2017 0919   CO2 29 04/02/2017 0919   BUN 14 04/02/2017 0919   CREATININE 0.90 04/02/2017 0919   GLUCOSE 113 (H) 04/02/2017 0919   CALCIUM 9.8 04/02/2017 0919   Hepatic Function Latest Ref Rng & Units 04/02/2017 03/07/2016 01/11/2015  Total Protein 6.0 - 8.3 g/dL 6.6 7.0 7.3  Albumin 3.5 - 5.2 g/dL 3.9 4.2 4.1  AST 0 - 37 U/L '18 20 18  '$ ALT 0 - 35 U/L '18 17 17  '$ Alk Phosphatase 39 - 117 U/L 58 61 69  Total Bilirubin 0.2 - 1.2 mg/dL 0.6 0.6 0.6  Bilirubin, Direct 0.0 - 0.3 mg/dL 0.1 0.1 0.1    Lab Results  Component Value Date   TSH 3.53 04/02/2017    No results found.  Assessment and Plan:   Healthcare maintenance  Health Maintenance Exam: The patient's preventative maintenance and recommended  screening tests for an annual wellness exam were reviewed in full today. Brought up to date unless services declined.  Counselled on the importance of diet, exercise, and its role in overall health and mortality. The patient's FH and SH was reviewed, including their home life, tobacco status, and drug and alcohol status.  Follow-up in 1 year for physical exam or additional follow-up below.  I have personally reviewed the Medicare Annual Wellness questionnaire and have noted 1. The patient's medical and social history 2. Their use of alcohol, tobacco or illicit drugs 3. Their current medications and supplements 4. The patient's functional ability including ADL's, fall risks, home safety risks and hearing or visual             impairment. 5. Diet and physical activities 6. Evidence for depression or mood disorders 7. Reviewed  Updated provider list, see scanned forms and [facility] Snapshot.   The patients weight, height, BMI and visual acuity have been recorded in the chart I have made referrals, counseling and provided education to the patient based review of the above and I have provided the pt with a written personalized care plan for preventive services.  I have provided the patient with a copy of your personalized plan for preventive services. Instructed to take the time to review along with their updated medication list.  Follow-up: No Follow-up on file. Or follow-up in 1 year unless noted.  Future Appointments  Date Time Provider Alexander  04/10/2017  1:40 PM [facility]   Signed,  Raylene Carmickle T. Trasean Delima, MD   Allergies as of 04/09/2017      Reactions   Codeine Nausea Only      Medication List        Accurate as of 04/09/17 11:59 PM. Always use your most recent med list.          albuterol 108 (90 Base) MCG/ACT inhaler Commonly known as:  PROVENTIL HFA;VENTOLIN HFA Inhale 2 puffs into the lungs every 6 (six) hours as needed for wheezing.   amlodipine-atorvastatin 10-20 MG tablet Commonly known as:  CADUET TAKE 1/2 TABLETS BY MOUTH DAILY   calcium carbonate 600 MG Tabs tablet Commonly known as:  OS-CAL Take 600 mg by mouth 2 (two) times daily with a meal.   dorzolamide 2 % ophthalmic solution Commonly known as:  TRUSOPT Place 1 drop into the right eye 2 (two) times daily.   GLUCOSAMINE CHONDR COMPLEX PO Take 1 tablet by mouth daily.   latanoprost 0.005 % ophthalmic solution Commonly known as:  XALATAN   losartan-hydrochlorothiazide 50-12.5 MG tablet Commonly known as:  HYZAAR Take 1 tablet by mouth daily.   PRESERVISION AREDS PO Take 1 tablet by mouth 2 (two) times daily.   SPIRIVA HANDIHALER 18 MCG inhalation capsule Generic drug:  tiotropium INHALE CONTENTS OF ONE CAPSULE ONCE DAILY   Vitamin D 2000 units Caps Take by mouth 2 (two) times daily.   VITAMIN E  PO Take 1 capsule by mouth daily.

## 2017-04-10 ENCOUNTER — Ambulatory Visit
Admission: RE | Admit: 2017-04-10 | Discharge: 2017-04-10 | Disposition: A | Discharge status: Home / Self Care | Payer: Medicare Other | Source: Ambulatory Visit | Attending: Family Medicine | Admitting: Family Medicine

## 2017-04-10 DIAGNOSIS — Z1231 Encounter for screening mammogram for malignant neoplasm of breast: Secondary | ICD-10-CM | POA: Insufficient documentation

## 2017-04-12 ENCOUNTER — Telehealth: Payer: Self-pay

## 2017-04-12 NOTE — Telephone Encounter (Signed)
Appears already on med list.

## 2017-04-12 NOTE — Telephone Encounter (Signed)
Copied from CRM [phone]. Topic: General - Other >> Apr 12, 2017  2:32 PM Gerrianne ScalePayne, Angela L wrote: Reason for CRM: patient calling stating that Dr Patsy Lageropland wanted her to call back with the name of the medicine that she is taking Areds 2 soft gels per day there was no milligrams on the bottle per patient

## 2017-05-29 ENCOUNTER — Telehealth: Payer: Self-pay | Admitting: Family Medicine

## 2017-05-29 MED ORDER — AMLODIPINE BESYLATE 10 MG PO TABS: 10.0000 mg | ORAL_TABLET | Freq: Every day | ORAL | 3 refills | Status: DC

## 2017-05-29 MED ORDER — ATORVASTATIN CALCIUM 20 MG PO TABS: 20.0000 mg | ORAL_TABLET | Freq: Every day | ORAL | 3 refills | Status: DC

## 2017-05-29 NOTE — Telephone Encounter (Signed)
Return Rebecca Hamilton's call.  She was unavailable.  I advised I would try calling her again tomorrow.  I did go ahead and sent in separate prescriptions for amlodipine 10 mg and atorvastatin 20 mg to [facility].

## 2017-05-29 NOTE — Telephone Encounter (Signed)
Copied from CRM [phone]. Topic: Quick Communication - See Telephone Encounter >> May 29, 2017  2:01 PM Windy KalataMichael, Symone Cornman L, NT wrote: CRM for notification. See Telephone encounter for:  05/29/17.  Patient is calling and states the drug company called and told her that amlodipine-atorvastatin (CADUET) 10-20 MG tablet has went to a tier 3 and would like to change from amlodipine 1 to amlodipine and atorvastatin and wants her to take 1 of each pills instead of combined. Please contact patient and explain this to her.   [address] [place] [address]  [address]  [place] [address]  Phone: [phone] Fax: [phone]

## 2017-05-30 NOTE — Telephone Encounter (Signed)
Spoke with Rebecca Hamilton and explained what the tier levels mean.  I also advised that I have already sent in the separate prescriptions for her amlodipine and her atorvastatin.  She states she currently cutsthe Caduet in half. I advised she can cut the 2 seperate pills in half as well.  Patient states understanding.

## 2017-11-02 ENCOUNTER — Telehealth: Payer: Self-pay

## 2017-11-02 NOTE — Telephone Encounter (Signed)
Spoke with patient. Patient states that in March her Caduet combination had to be changed to 2 separate medications-Amlodipine 10 mg and Atorvastatin 20 mg, and she has been cutting these in half as she did with Caduet combination tablet. Patient states it is hard to cut these two medications and they crumble unevenly usually and she just kept forgetting to call back to ask us to change the dose of Amlodipine to 5 mg and Atorvastatin to 10 mg if possible so she does not have to try and cut them anymore. Please let patient know when this is done if it is approved. Thank Neta Mendsyou-Nadia Viar V Fahad Cisse, RMA

## 2017-11-02 NOTE — Telephone Encounter (Signed)
Ok to change

## 2017-11-03 NOTE — Telephone Encounter (Signed)
Yes, change to   amlodipidine 5 mg   atortvastatin 10 mg  Once daily  I assume she would want #90, 3 ref

## 2017-11-07 ENCOUNTER — Other Ambulatory Visit: Payer: Self-pay | Admitting: Family Medicine

## 2017-11-19 ENCOUNTER — Telehealth: Payer: Self-pay

## 2017-11-19 MED ORDER — ATORVASTATIN CALCIUM 10 MG PO TABS: 10.0000 mg | ORAL_TABLET | Freq: Every day | ORAL | 3 refills | Status: DC

## 2017-11-19 MED ORDER — AMLODIPINE BESYLATE 5 MG PO TABS: 5.0000 mg | ORAL_TABLET | Freq: Every day | ORAL | 3 refills | Status: DC

## 2017-11-19 NOTE — Telephone Encounter (Signed)
done

## 2017-11-19 NOTE — Telephone Encounter (Signed)
Landon with [facility] left v/m requesting new rxs sent in for amlodipine 5 mg and atorvastatin 10 mg so pt does not have to cut the amlodipine 10 mg and atorvastatin 20 mg in half.Please advise.

## 2018-02-05 ENCOUNTER — Other Ambulatory Visit: Payer: Self-pay | Admitting: Family Medicine

## 2018-03-11 ENCOUNTER — Other Ambulatory Visit: Payer: Self-pay | Admitting: Family Medicine

## 2018-03-11 DIAGNOSIS — Z1231 Encounter for screening mammogram for malignant neoplasm of breast: Secondary | ICD-10-CM

## 2018-04-08 ENCOUNTER — Telehealth: Payer: Self-pay

## 2018-04-08 NOTE — Progress Notes (Signed)
Dr. Frederico Hamman T. Enrigue Hashimi, MD, [facility] [place], [address] Phone: [phone] Fax: [phone]  04/10/2018  Patient: Rebecca Hamilton, MRN: [id number], DOB: 1935/08/11, 83 y.o.  Primary Physician:  Owens Loffler, MD   Chief Complaint  Patient presents with  . Medicare Wellness   Subjective:   Rebecca Hamilton is a 83 y.o. pleasant patient who presents for a medicare wellness examination and general physical exam:  Health Maintenance Summary Reviewed and updated, unless pt declines services.  Tobacco History Reviewed. Non-smoker Alcohol: No concerns, no excessive use Exercise Habits: Some activity, rec at least 30 mins 5 times a week STD concerns: none Drug Use: None Birth control method: n/a Menses regular: n/a Lumps or breast concerns: no Breast Cancer Family History: no  Husband just had an ablation, AF  Iron level was too low   All physical labs Add ferritin  Email Zenovia Jarred - ask about colon cancer screening at age 54 with CT Colonoscopy at 01/2013.  Health Maintenance  Topic Date Due  . TETANUS/TDAP  12/02/2015  . COLONOSCOPY  12/26/2016  . MAMMOGRAM  04/10/2018  . INFLUENZA VACCINE  Completed  . DEXA SCAN  Completed  . PNA vac Low Risk Adult  Completed   Immunization History  Administered Date(s) Administered  . Hepatitis A 06/26/1995, 12/26/1995  . Hepatitis B 12/01/2005, 01/01/2006, 07/06/2006  . Influenza Split 12/29/2011  . Influenza Whole 03/14/2007  . Influenza,inj,Quad PF,6+ Mos 12/03/2013, 01/11/2015, 01/18/2016, 01/04/2017, 12/24/2017  . Influenza-Unspecified 12/11/2012  . Pneumococcal Conjugate-13 12/03/2013  . Pneumococcal Polysaccharide-23 03/13/2005, 12/01/2005  . Td 03/13/2005, 12/01/2005  . Zoster 07/06/2006  . Zoster Recombinat (Shingrix) 12/27/2017, 03/25/2018    Patient Active Problem List   Diagnosis Date Noted  . History of abnormal Pap smear 10/11/2010  .  FATIGUE 09/01/2009  . OSTEOPENIA 06/25/2008  . Hyperlipidemia 05/22/2008  . UNSPECIFIED ANEMIA 05/22/2008  . HYPERTENSION 05/22/2008  . COPD exacerbation ([place]) 05/22/2008  . GERD 05/22/2008  . COLONIC POLYPS, HX OF 05/22/2008   Past Medical History:  Diagnosis Date  . Colon polyps   . COPD (chronic obstructive pulmonary disease) ([place])   . GERD (gastroesophageal reflux disease)   . HTN (hypertension)   . Hyperlipidemia   . Osteopenia    History reviewed. No pertinent surgical history. Social History   Socioeconomic History  . Marital status: Married    Spouse name: Not on file  . Number of children: 2  . Years of education: Not on file  . Highest education level: Not on file  Occupational History  . 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 Financial resource strain: Not on file  . Food insecurity:    Worry: Not on file    Inability: Not on file  . Transportation needs:    Medical: Not on file    Non-medical: Not on file  Tobacco Use  . Smoking status: Former Research scientist (life sciences)  . Smokeless tobacco: Never Used  . Tobacco comment: quit 1988  Substance and Sexual Activity  . Alcohol use: Yes    Comment: daily  . Drug use: No  . Sexual activity: Not on file  Lifestyle  . Physical activity:    Days per week: Not on file    Minutes per session: Not on file  . Stress: Not on file  Relationships  . Social connections:    Talks on phone: Not on file    Gets  together: Not on file    Attends religious service: Not on file    Active member of club or organization: Not on file    Attends meetings of clubs or organizations: Not on file    Relationship status: Not on file  . Intimate partner violence:    Fear of current or ex partner: Not on file    Emotionally abused: Not on file    Physically abused: Not on file    Forced sexual activity: Not on file  Other Topics Concern  . Not on file  Social History Narrative   Regular exercise--no   Family History    Problem Relation Age of Onset  . Diabetes Father   . Heart disease Father   . Diabetes Brother   . Heart disease Mother   . Breast cancer Neg Hx    Allergies  Allergen Reactions  . Codeine Nausea Only    Medication list has been reviewed and updated.   General: Denies fever, chills, sweats. No significant weight loss. Eyes: Denies blurring,significant itching ENT: Denies earache, sore throat, and hoarseness.  Cardiovascular: Denies chest pains, palpitations, dyspnea on exertion,  Respiratory: Denies cough, dyspnea at rest,wheeezing Breast: no concerns about lumps GI: Denies nausea, vomiting, diarrhea, constipation, change in bowel habits, abdominal pain, melena, hematochezia GU: Denies dysuria, hematuria, urinary hesitancy, nocturia, denies STD risk, no concerns about discharge Musculoskeletal: Denies back pain, joint pain Derm: Denies rash, itching Neuro: Denies  paresthesias, frequent falls, frequent headaches Psych: Denies depression, anxiety Endocrine: Denies cold intolerance, heat intolerance, polydipsia Heme: Denies enlarged lymph nodes Allergy: No hayfever  Objective:   BP 108/60   Pulse 77   Temp 97.6 F (36.4 C) (Oral)   Ht 5' 1.5" (1.562 m)   Wt 149 lb 8 oz (67.8 kg)   SpO2 99%   BMI 27.79 kg/m   The patient completed a fall screen and PHQ-2 and PHQ-9 if necessary, which is documented in the EHR. The CMA/LPN/RN who assisted the patient verbally completed with them and documented results in [facility] EHR.  Hearing Screening Comments: Wears Bilateral Hearing Aides Vision Screening Comments: Wears Glasses-Eye Exam with Dr. Stephenie Acres 03/15/2018 at [place]: well developed, well nourished, no acute distress Eyes: conjunctiva and lids normal, PERRLA, EOMI ENT: TM clear, nares clear, oral exam WNL Neck: supple, no lymphadenopathy, no thyromegaly, no JVD Pulm: clear to auscultation and percussion, respiratory effort normal CV: regular rate and  rhythm, S1-S2, no murmur, rub or gallop, no bruits Chest: no scars, masses, no lumps BREAST: breast exam declined GI: soft, non-tender; no hepatosplenomegaly, masses; active bowel sounds all quadrants GU: GU exam declined Lymph: no cervical, axillary or inguinal adenopathy MSK: gait normal, muscle tone and strength WNL, no joint swelling, effusions, discoloration, crepitus  SKIN: clear, good turgor, color WNL, no rashes, lesions, or ulcerations Neuro: normal mental status, normal strength, sensation, and motion Psych: alert; oriented to person, place and time, normally interactive and not anxious or depressed in appearance.  All labs reviewed with patient.  Lipids: Lab Results  Component Value Date   CHOL 150 04/02/2017   Lab Results  Component Value Date   HDL 68.20 04/02/2017   Lab Results  Component Value Date   LDLCALC 71 04/02/2017   Lab Results  Component Value Date   TRIG 56.0 04/02/2017   Lab Results  Component Value Date   CHOLHDL 2 04/02/2017   CBC: CBC Latest Ref Rng & Units 04/02/2017 03/07/2016 01/11/2015  WBC  4.0 - 10.5 K/uL 5.4 6.4 7.9  Hemoglobin 12.0 - 15.0 g/dL 13.2 13.7 13.5  Hematocrit 36.0 - 46.0 % 40.3 40.4 41.0  Platelets 150.0 - 400.0 K/uL 250.0 275.0 833.8    Basic Metabolic Panel:    Component Value Date/Time   NA 136 04/02/2017 0919   K 4.0 04/02/2017 0919   CL 101 04/02/2017 0919   CO2 29 04/02/2017 0919   BUN 14 04/02/2017 0919   CREATININE 0.90 04/02/2017 0919   GLUCOSE 113 (H) 04/02/2017 0919   CALCIUM 9.8 04/02/2017 0919   Hepatic Function Latest Ref Rng & Units 04/02/2017 03/07/2016 01/11/2015  Total Protein 6.0 - 8.3 g/dL 6.6 7.0 7.3  Albumin 3.5 - 5.2 g/dL 3.9 4.2 4.1  AST 0 - 37 U/L '18 20 18  '$ ALT 0 - 35 U/L '18 17 17  '$ Alk Phosphatase 39 - 117 U/L 58 61 69  Total Bilirubin 0.2 - 1.2 mg/dL 0.6 0.6 0.6  Bilirubin, Direct 0.0 - 0.3 mg/dL 0.1 0.1 0.1    Lab Results  Component Value Date   HGBA1C 5.9 06/25/2008   Lab  Results  Component Value Date   TSH 3.53 04/02/2017    No results found.  Assessment and Plan:   Healthcare maintenance   Doing well - we are going to check with GI regarding colon cancer screening for her mammo is upcoming  Health Maintenance Exam: The patient's preventative maintenance and recommended screening tests for an annual wellness exam were reviewed in full today. Brought up to date unless services declined.  Counselled on the importance of diet, exercise, and its role in overall health and mortality. The patient's FH and SH was reviewed, including their home life, tobacco status, and drug and alcohol status.  Follow-up in 1 year for physical exam or additional follow-up below.  I have personally reviewed the Medicare Annual Wellness questionnaire and have noted 1. The patient's medical and social history 2. Their use of alcohol, tobacco or illicit drugs 3. Their current medications and supplements 4. The patient's functional ability including ADL's, fall risks, home safety risks and hearing or visual             impairment. 5. Diet and physical activities 6. Evidence for depression or mood disorders 7. Reviewed Updated provider list, see scanned forms and [facility] Snapshot.   The patients weight, height, BMI and visual acuity have been recorded in the chart I have made referrals, counseling and provided education to the patient based review of the above and I have provided the pt with a written personalized care plan for preventive services.  I have provided the patient with a copy of your personalized plan for preventive services. Instructed to take the time to review along with their updated medication list.  Follow-up: No follow-ups on file. Or follow-up in 1 year unless noted.  Signed,  Maud Deed. Rovena Hearld, MD   Allergies as of 04/10/2018      Reactions   Codeine Nausea Only      Medication List       Accurate as of April 10, 2018  9:22 AM. Always use  your most recent med list.        albuterol 108 (90 Base) MCG/ACT inhaler Commonly known as:  PROVENTIL HFA;VENTOLIN HFA Inhale 2 puffs into the lungs every 6 (six) hours as needed for wheezing.   amLODipine 5 MG tablet Commonly known as:  NORVASC Take 1 tablet (5 mg total) by mouth daily.   atorvastatin 10  MG tablet Commonly known as:  LIPITOR Take 1 tablet (10 mg total) by mouth daily.   dorzolamide 2 % ophthalmic solution Commonly known as:  TRUSOPT Place 1 drop into the right eye 2 (two) times daily.   GLUCOSAMINE CHONDR COMPLEX PO Take 1 tablet by mouth daily.   ICAPS AREDS 2 Caps Take 1 each by mouth 2 (two) times daily.   latanoprost 0.005 % ophthalmic solution Commonly known as:  XALATAN   losartan-hydrochlorothiazide 50-12.5 MG tablet Commonly known as:  HYZAAR TAKE 1 TABLET BY MOUTH ONCE A DAY   SPIRIVA HANDIHALER 18 MCG inhalation capsule Generic drug:  tiotropium INHALE CONTENTS OF ONE CAPSULE ONCE DAILY   Vitamin D 50 MCG (2000 UT) Caps Take by mouth 2 (two) times daily.

## 2018-04-08 NOTE — Telephone Encounter (Signed)
[facility] Night - Client Nonclinical Telephone Record [facility] Medical Call Center Client [facility] Night - Client Client Site [facility] - Night Physician Hannah Beat - MD Contact Type Call Who Is Calling Patient / Member / Family / Caregiver Caller Name Colby Garant Caller Phone Number [phone] Patient Name Rebecca Hamilton Patient DOB [birth year] Call Type Message Only Information Provided Reason for Call Request for General Office Information Initial Comment Caller states she missed a call about an appt on Monday. She thinks her app is on Wednesday. Additional Comment She needs a call back on Monday about this please. Call Closed By: Olevia Bowens Transaction Date/Time: 04/05/2018 5:36:46 PM (ET)

## 2018-04-08 NOTE — Telephone Encounter (Signed)
Spouse aware.

## 2018-04-10 ENCOUNTER — Ambulatory Visit (INDEPENDENT_AMBULATORY_CARE_PROVIDER_SITE_OTHER): Payer: Medicare Other | Admitting: Family Medicine

## 2018-04-10 ENCOUNTER — Encounter: Payer: Self-pay | Admitting: Family Medicine

## 2018-04-10 VITALS — BP 108/60 | HR 77 | Temp 97.6°F | Ht 61.5 in | Wt 149.5 lb

## 2018-04-10 DIAGNOSIS — E559 Vitamin D deficiency, unspecified: Secondary | ICD-10-CM

## 2018-04-10 DIAGNOSIS — Z79899 Other long term (current) drug therapy: Secondary | ICD-10-CM

## 2018-04-10 DIAGNOSIS — R739 Hyperglycemia, unspecified: Secondary | ICD-10-CM

## 2018-04-10 DIAGNOSIS — E782 Mixed hyperlipidemia: Secondary | ICD-10-CM | POA: Diagnosis not present

## 2018-04-10 DIAGNOSIS — R790 Abnormal level of blood mineral: Secondary | ICD-10-CM | POA: Diagnosis not present

## 2018-04-10 DIAGNOSIS — Z0000 Encounter for general adult medical examination without abnormal findings: Secondary | ICD-10-CM

## 2018-04-10 DIAGNOSIS — R5383 Other fatigue: Secondary | ICD-10-CM

## 2018-04-10 LAB — LIPID PANEL
Cholesterol: 145 mg/dL (ref 0–200)
HDL: 64.9 mg/dL (ref >39.00)
LDL Cholesterol: 66 mg/dL (ref 0–99)
NONHDL: 79.99
Total CHOL/HDL Ratio: 2
Triglycerides: 72 mg/dL (ref 0.0–149.0)
VLDL: 14.4 mg/dL (ref 0.0–40.0)

## 2018-04-10 LAB — HEPATIC FUNCTION PANEL
ALBUMIN: 4.1 g/dL (ref 3.5–5.2)
ALK PHOS: 73 U/L (ref 39–117)
ALT: 22 U/L (ref 0–35)
AST: 21 U/L (ref 0–37)
BILIRUBIN DIRECT: 0.1 mg/dL (ref 0.0–0.3)
TOTAL PROTEIN: 6.9 g/dL (ref 6.0–8.3)
Total Bilirubin: 0.4 mg/dL (ref 0.2–1.2)

## 2018-04-10 LAB — BASIC METABOLIC PANEL
BUN: 14 mg/dL (ref 6–23)
CALCIUM: 9.9 mg/dL (ref 8.4–10.5)
CO2: 28 mEq/L (ref 19–32)
Chloride: 103 mEq/L (ref 96–112)
Creatinine, Ser: 0.82 mg/dL (ref 0.40–1.20)
GFR: 66.96 mL/min (ref >60.00)
Glucose, Bld: 69 mg/dL — ABNORMAL LOW (ref 70–99)
Potassium: 3.9 mEq/L (ref 3.5–5.1)
SODIUM: 137 meq/L (ref 135–145)

## 2018-04-10 LAB — CBC WITH DIFFERENTIAL/PLATELET
BASOS ABS: 0.1 10*3/uL (ref 0.0–0.1)
Basophils Relative: 0.7 % (ref 0.0–3.0)
EOS ABS: 0.2 10*3/uL (ref 0.0–0.7)
Eosinophils Relative: 2.5 % (ref 0.0–5.0)
HEMATOCRIT: 42.1 % (ref 36.0–46.0)
Hemoglobin: 13.6 g/dL (ref 12.0–15.0)
LYMPHS PCT: 16.7 % (ref 12.0–46.0)
Lymphs Abs: 1.3 10*3/uL (ref 0.7–4.0)
MCHC: 32.3 g/dL (ref 30.0–36.0)
MCV: 88.9 fl (ref 78.0–100.0)
MONOS PCT: 6.3 % (ref 3.0–12.0)
Monocytes Absolute: 0.5 10*3/uL (ref 0.1–1.0)
NEUTROS PCT: 73.8 % (ref 43.0–77.0)
Neutro Abs: 5.9 10*3/uL (ref 1.4–7.7)
Platelets: 259 10*3/uL (ref 150.0–400.0)
RBC: 4.74 Mil/uL (ref 3.87–5.11)
RDW: 14.5 % (ref 11.5–15.5)
WBC: 8 10*3/uL (ref 4.0–10.5)

## 2018-04-10 LAB — FERRITIN: Ferritin: 11.5 ng/mL (ref 10.0–291.0)

## 2018-04-10 LAB — VITAMIN D 25 HYDROXY (VIT D DEFICIENCY, FRACTURES): VITD: 68.83 ng/mL (ref 30.00–100.00)

## 2018-04-10 LAB — HEMOGLOBIN A1C: Hgb A1c MFr Bld: 5.8 % (ref 4.6–6.5)

## 2018-04-10 LAB — TSH: TSH: 2.1 u[IU]/mL (ref 0.35–4.50)

## 2018-04-10 MED ORDER — LOSARTAN POTASSIUM-HCTZ 50-12.5 MG PO TABS: 1.0000 | ORAL_TABLET | Freq: Every day | ORAL | 3 refills | Status: DC

## 2018-04-10 MED ORDER — TIOTROPIUM BROMIDE MONOHYDRATE 18 MCG IN CAPS: ORAL_CAPSULE | RESPIRATORY_TRACT | 3 refills | Status: DC

## 2018-04-11 ENCOUNTER — Ambulatory Visit
Admission: RE | Admit: 2018-04-11 | Discharge: 2018-04-11 | Disposition: A | Discharge status: Home / Self Care | Payer: Medicare Other | Source: Ambulatory Visit | Attending: Family Medicine | Admitting: Family Medicine

## 2018-04-11 DIAGNOSIS — Z1231 Encounter for screening mammogram for malignant neoplasm of breast: Secondary | ICD-10-CM | POA: Insufficient documentation

## 2018-04-13 ENCOUNTER — Telehealth: Payer: Self-pay | Admitting: Family Medicine

## 2018-04-13 DIAGNOSIS — Z86010 Personal history of colonic polyps: Secondary | ICD-10-CM

## 2018-04-13 DIAGNOSIS — Z1211 Encounter for screening for malignant neoplasm of colon: Secondary | ICD-10-CM

## 2018-04-13 DIAGNOSIS — Q438 Other specified congenital malformations of intestine: Secondary | ICD-10-CM

## 2018-04-13 NOTE — Telephone Encounter (Signed)
Please call  I spoke to Dr. Rhea Belton from GI about colon cancer screening specifically for her.   He recommended that we do a follow-up virtual colonoscopy.  This is the scan that she had last time in 2014.  He thinks this type of screening should be covered by Medicare for her without issue.  I can set up at any time. Let me know.

## 2018-04-15 NOTE — Addendum Note (Signed)
Addended by: Hannah Beat on: 04/15/2018 03:21 PM   Modules accepted: Orders

## 2018-04-15 NOTE — Telephone Encounter (Signed)
Ordered. Reviewed with Dr. Rhea BeltonPyrtle through internal [facility].

## 2018-04-15 NOTE — Telephone Encounter (Signed)
Mrs. Bitney notified as instructed by telephone.  She would like for Dr. Patsy Lager to go ahead and get this set up for her.

## 2018-05-02 ENCOUNTER — Telehealth: Payer: Self-pay

## 2018-05-02 NOTE — Telephone Encounter (Signed)
error 

## 2018-05-02 NOTE — Telephone Encounter (Signed)
Pt called and wanted to know if Dr Rhea Belton would be doing colonoscopy on 05/14/18. Per note pt was notified as instructed and pt voiced understanding. Pt said she did remember the procedure in 2014. Pt wants to know if Dr Rhea Belton or Dr Patsy Lager will FU with pt after virtual colonoscopy is done. Pt request Lupita Leash CMA to call her back.

## 2018-05-03 NOTE — Telephone Encounter (Signed)
We have talked about this a few times.  She has an appointment for a VIRTUAL colonoscopy.  I will let her know the results - she does not need to make an appointment.  If any additional follow-up is needed, then I can set her up with GI, but let's hope it is normal.  05/14/2018 10:00 AM at [facility]

## 2018-05-03 NOTE — Telephone Encounter (Signed)
Ms. Klenke notified as instructed by telephone.  Patient states understanding.

## 2018-05-14 ENCOUNTER — Ambulatory Visit
Admission: RE | Admit: 2018-05-14 | Discharge: 2018-05-14 | Disposition: A | Discharge status: Home / Self Care | Payer: Medicare Other | Source: Ambulatory Visit | Attending: Family Medicine | Admitting: Family Medicine

## 2018-05-14 DIAGNOSIS — Z1211 Encounter for screening for malignant neoplasm of colon: Secondary | ICD-10-CM

## 2018-05-14 DIAGNOSIS — Q438 Other specified congenital malformations of intestine: Secondary | ICD-10-CM

## 2018-05-14 DIAGNOSIS — Z86010 Personal history of colonic polyps: Secondary | ICD-10-CM

## 2018-05-30 ENCOUNTER — Telehealth: Payer: Self-pay

## 2018-05-30 NOTE — Telephone Encounter (Signed)
Pt called to get screening colonoscopy result from 05/14/18. Pt notified as instructed and pt voiced understanding. Pt having problem with my chart and pt will call [phone] for assistance with mychart. Nothing further needed.

## 2018-09-11 ENCOUNTER — Other Ambulatory Visit: Payer: Self-pay | Admitting: Family Medicine

## 2018-10-02 ENCOUNTER — Other Ambulatory Visit: Payer: Self-pay

## 2018-10-03 ENCOUNTER — Other Ambulatory Visit: Payer: Medicare Other

## 2018-10-03 NOTE — Discharge Instructions (Signed)

## 2018-10-04 ENCOUNTER — Other Ambulatory Visit
Admission: RE | Admit: 2018-10-04 | Discharge: 2018-10-04 | Disposition: A | Discharge status: Home / Self Care | Payer: Medicare Other | Source: Ambulatory Visit | Attending: Ophthalmology | Admitting: Ophthalmology

## 2018-10-04 ENCOUNTER — Other Ambulatory Visit: Payer: Self-pay

## 2018-10-04 DIAGNOSIS — Z1159 Encounter for screening for other viral diseases: Secondary | ICD-10-CM | POA: Diagnosis present

## 2018-10-05 LAB — SARS CORONAVIRUS 2 (TAT 6-24 HRS): SARS Coronavirus 2: NEGATIVE

## 2018-10-08 ENCOUNTER — Ambulatory Visit: Payer: Medicare Other | Admitting: Anesthesiology

## 2018-10-08 ENCOUNTER — Encounter
Admission: RE | Disposition: A | Discharge status: Home / Self Care | Payer: Self-pay | Source: Home / Self Care | Attending: Ophthalmology

## 2018-10-08 ENCOUNTER — Ambulatory Visit
Admission: RE | Admit: 2018-10-08 | Discharge: 2018-10-08 | Disposition: A | Discharge status: Home / Self Care | Payer: Medicare Other | Attending: Ophthalmology | Admitting: Ophthalmology

## 2018-10-08 DIAGNOSIS — E7800 Pure hypercholesterolemia, unspecified: Secondary | ICD-10-CM | POA: Insufficient documentation

## 2018-10-08 DIAGNOSIS — Z79899 Other long term (current) drug therapy: Secondary | ICD-10-CM | POA: Diagnosis not present

## 2018-10-08 DIAGNOSIS — J449 Chronic obstructive pulmonary disease, unspecified: Secondary | ICD-10-CM | POA: Diagnosis not present

## 2018-10-08 DIAGNOSIS — H2512 Age-related nuclear cataract, left eye: Secondary | ICD-10-CM | POA: Insufficient documentation

## 2018-10-08 DIAGNOSIS — M1990 Unspecified osteoarthritis, unspecified site: Secondary | ICD-10-CM | POA: Diagnosis not present

## 2018-10-08 DIAGNOSIS — Z87891 Personal history of nicotine dependence: Secondary | ICD-10-CM | POA: Diagnosis not present

## 2018-10-08 DIAGNOSIS — H9190 Unspecified hearing loss, unspecified ear: Secondary | ICD-10-CM | POA: Insufficient documentation

## 2018-10-08 DIAGNOSIS — K219 Gastro-esophageal reflux disease without esophagitis: Secondary | ICD-10-CM | POA: Diagnosis not present

## 2018-10-08 DIAGNOSIS — I10 Essential (primary) hypertension: Secondary | ICD-10-CM | POA: Diagnosis not present

## 2018-10-08 HISTORY — PX: CATARACT EXTRACTION W/PHACO: SHX586

## 2018-10-08 SURGERY — PHACOEMULSIFICATION, CATARACT, WITH IOL INSERTION
Anesthesia: Monitor Anesthesia Care | Site: Eye | Laterality: Left

## 2018-10-08 MED ORDER — LIDOCAINE HCL (PF) 2 % IJ SOLN
INTRAOCULAR | Status: DC | PRN
  Administered 2018-10-08: 2 mL

## 2018-10-08 MED ORDER — NA CHONDROIT SULF-NA HYALURON 40-17 MG/ML IO SOLN
INTRAOCULAR | Status: DC | PRN
  Administered 2018-10-08: 1 mL via INTRAOCULAR

## 2018-10-08 MED ORDER — EPINEPHRINE PF 1 MG/ML IJ SOLN
INTRAOCULAR | Status: DC | PRN
  Administered 2018-10-08: 62 mL via OPHTHALMIC

## 2018-10-08 MED ORDER — MOXIFLOXACIN HCL 0.5 % OP SOLN
OPHTHALMIC | Status: DC | PRN
  Administered 2018-10-08: 0.2 mL via OPHTHALMIC

## 2018-10-08 MED ORDER — ACETAMINOPHEN 160 MG/5ML PO SOLN: 325.0000 mg | Freq: Once | ORAL | Status: DC

## 2018-10-08 MED ORDER — ACETAMINOPHEN 325 MG PO TABS: 325.0000 mg | ORAL_TABLET | Freq: Once | ORAL | Status: DC

## 2018-10-08 MED ORDER — TETRACAINE HCL 0.5 % OP SOLN
1.0000 [drp] | OPHTHALMIC | Status: DC | PRN
  Administered 2018-10-08 (×3): 1 [drp] via OPHTHALMIC

## 2018-10-08 MED ORDER — LACTATED RINGERS IV SOLN: INTRAVENOUS | Status: DC

## 2018-10-08 MED ORDER — BRIMONIDINE TARTRATE-TIMOLOL 0.2-0.5 % OP SOLN
OPHTHALMIC | Status: DC | PRN
  Administered 2018-10-08: 1 [drp] via OPHTHALMIC

## 2018-10-08 MED ORDER — MIDAZOLAM HCL 2 MG/2ML IJ SOLN
INTRAMUSCULAR | Status: DC | PRN
  Administered 2018-10-08: 2 mg via INTRAVENOUS

## 2018-10-08 MED ORDER — FENTANYL CITRATE (PF) 100 MCG/2ML IJ SOLN
INTRAMUSCULAR | Status: DC | PRN
  Administered 2018-10-08: 50 ug via INTRAVENOUS

## 2018-10-08 MED ORDER — ARMC OPHTHALMIC DILATING DROPS
1.0000 "application " | OPHTHALMIC | Status: DC | PRN
  Administered 2018-10-08 (×3): 1 via OPHTHALMIC

## 2018-10-08 SURGICAL SUPPLY — 19 items

## 2018-10-08 NOTE — H&P (Signed)
All labs reviewed. Abnormal studies sent to patients PCP when indicated.  Previous H&P reviewed, patient examined, there are NO CHANGES.  Rebecca Gilliam Porfilio7/28/20208:22 AM

## 2018-10-08 NOTE — Transfer of Care (Signed)
Immediate Anesthesia Transfer of Care Note  Patient: Rebecca Hamilton  Procedure(s) Performed: CATARACT EXTRACTION PHACO AND INTRAOCULAR LENS PLACEMENT (IOC) LEFT (Left Eye)  Patient Location: PACU  Anesthesia Type: MAC  Level of Consciousness: awake, alert  and patient cooperative  Airway and Oxygen Therapy: Patient Spontanous Breathing and Patient connected to supplemental oxygen  Post-op Assessment: Post-op Vital signs reviewed, Patient's Cardiovascular Status Stable, Respiratory Function Stable, Patent Airway and No signs of Nausea or vomiting  Post-op Vital Signs: Reviewed and stable  Complications: No apparent anesthesia complications

## 2018-10-08 NOTE — Anesthesia Procedure Notes (Signed)
Procedure Name: MAC Performed by: Toneisha Savary, CRNA Pre-anesthesia Checklist: Patient identified, Emergency Drugs available, Suction available, Timeout performed and Patient being monitored Patient Re-evaluated:Patient Re-evaluated prior to induction Oxygen Delivery Method: Nasal cannula Placement Confirmation: positive ETCO2       

## 2018-10-08 NOTE — Op Note (Signed)
PREOPERATIVE DIAGNOSIS:  Nuclear sclerotic cataract of the left eye.   POSTOPERATIVE DIAGNOSIS:  Nuclear sclerotic cataract of the left eye.   OPERATIVE PROCEDURE: Procedure(s): CATARACT EXTRACTION PHACO AND INTRAOCULAR LENS PLACEMENT ([place]) LEFT   SURGEON:  Birder Robson, MD.   ANESTHESIA:  Anesthesiologist: Ronelle Nigh, MD CRNA: Mayme Genta, CRNA; Cameron Ali, CRNA  1.      Managed anesthesia care. 2.     0.26ml of Shugarcaine was instilled following the paracentesis   COMPLICATIONS:  None.   TECHNIQUE:   Stop and chop   DESCRIPTION OF PROCEDURE:  The patient was examined and consented in the preoperative holding area where the aforementioned topical anesthesia was applied to the left eye and then brought back to the Operating Room where the left eye was prepped and draped in the usual sterile ophthalmic fashion and a lid speculum was placed. A paracentesis was created with the side port blade and the anterior chamber was filled with viscoelastic. A near clear corneal incision was performed with the steel keratome. A continuous curvilinear capsulorrhexis was performed with a cystotome followed by the capsulorrhexis forceps. Hydrodissection and hydrodelineation were carried out with BSS on a blunt cannula. The lens was removed in a stop and chop  technique and the remaining cortical material was removed with the irrigation-aspiration handpiece. The capsular bag was inflated with viscoelastic and the Technis [id number] lens was placed in the capsular bag without complication. The remaining viscoelastic was removed from the eye with the irrigation-aspiration handpiece. The wounds were hydrated. The anterior chamber was flushed with BSS and the eye was inflated to physiologic pressure. 0.24ml Vigamox was placed in the anterior chamber. The wounds were found to be water tight. The eye was dressed with [place]. The patient was given protective glasses to wear throughout the day and a shield with  which to sleep tonight. The patient was also given drops with which to begin a drop regimen today and will follow-up with me in one day. Implant Name Type Inv. Item Serial No. Manufacturer Lot No. LRB No. Used Action  LENS IOL DIOP 19.5 - [id number] Intraocular Lens LENS IOL DIOP 19.5 [id number] AMO  Left 1 Implanted    Procedure(s): CATARACT EXTRACTION PHACO AND INTRAOCULAR LENS PLACEMENT (IOC) LEFT (Left)  Electronically signed: Birder Robson 10/08/2018 8:48 AM

## 2018-10-08 NOTE — Anesthesia Postprocedure Evaluation (Signed)
Anesthesia Post Note  Patient: ANNASTYN SILVEY  Procedure(s) Performed: CATARACT EXTRACTION PHACO AND INTRAOCULAR LENS PLACEMENT ([place]) LEFT (Left Eye)  Patient location during evaluation: PACU Anesthesia Type: MAC Level of consciousness: awake and alert and oriented Pain management: satisfactory to patient Vital Signs Assessment: post-procedure vital signs reviewed and stable Respiratory status: spontaneous breathing, nonlabored ventilation and respiratory function stable Cardiovascular status: blood pressure returned to baseline and stable Postop Assessment: Adequate PO intake and No signs of nausea or vomiting Anesthetic complications: no    Raliegh Ip

## 2018-10-08 NOTE — Anesthesia Preprocedure Evaluation (Signed)
Anesthesia Evaluation  Patient identified by MRN, date of birth, ID band Patient awake    Reviewed: Allergy & Precautions, H&P , NPO status , Patient's Chart, lab work & pertinent test results  Airway Mallampati: II  TM Distance: >3 FB Neck ROM: full    Dental no notable dental hx.    Pulmonary COPD,  COPD inhaler, former smoker,    Pulmonary exam normal breath sounds clear to auscultation       Cardiovascular hypertension, Normal cardiovascular exam Rhythm:regular Rate:Normal     Neuro/Psych    GI/Hepatic   Endo/Other    Renal/GU      Musculoskeletal   Abdominal   Peds  Hematology   Anesthesia Other Findings   Reproductive/Obstetrics                             Anesthesia Physical Anesthesia Plan  ASA: III  Anesthesia Plan: MAC   Post-op Pain Management:    Induction:   PONV Risk Score and Plan: 2 and Midazolam, TIVA and Treatment may vary due to age or medical condition  Airway Management Planned:   Additional Equipment:   Intra-op Plan:   Post-operative Plan:   Informed Consent: I have reviewed the patients History and Physical, chart, labs and discussed the procedure including the risks, benefits and alternatives for the proposed anesthesia with the patient or authorized representative who has indicated his/her understanding and acceptance.       Plan Discussed with: CRNA  Anesthesia Plan Comments:         Anesthesia Quick Evaluation

## 2018-10-09 ENCOUNTER — Encounter: Payer: Self-pay | Admitting: Ophthalmology

## 2018-10-30 ENCOUNTER — Encounter: Payer: Self-pay | Admitting: *Deleted

## 2018-10-30 ENCOUNTER — Other Ambulatory Visit: Payer: Self-pay

## 2018-11-01 ENCOUNTER — Other Ambulatory Visit
Admission: RE | Admit: 2018-11-01 | Discharge: 2018-11-01 | Disposition: A | Discharge status: Home / Self Care | Payer: Medicare Other | Source: Ambulatory Visit | Attending: Ophthalmology | Admitting: Ophthalmology

## 2018-11-01 ENCOUNTER — Other Ambulatory Visit: Payer: Self-pay

## 2018-11-01 DIAGNOSIS — Z01812 Encounter for preprocedural laboratory examination: Secondary | ICD-10-CM | POA: Insufficient documentation

## 2018-11-01 DIAGNOSIS — Z20828 Contact with and (suspected) exposure to other viral communicable diseases: Secondary | ICD-10-CM | POA: Diagnosis not present

## 2018-11-01 LAB — SARS CORONAVIRUS 2 (TAT 6-24 HRS): SARS Coronavirus 2: NEGATIVE

## 2018-11-01 NOTE — Discharge Instructions (Signed)

## 2018-11-05 ENCOUNTER — Ambulatory Visit: Payer: Medicare Other | Admitting: Anesthesiology

## 2018-11-05 ENCOUNTER — Ambulatory Visit
Admission: RE | Admit: 2018-11-05 | Discharge: 2018-11-05 | Disposition: A | Discharge status: Home / Self Care | Payer: Medicare Other | Attending: Ophthalmology | Admitting: Ophthalmology

## 2018-11-05 ENCOUNTER — Other Ambulatory Visit: Payer: Self-pay

## 2018-11-05 ENCOUNTER — Encounter
Admission: RE | Disposition: A | Discharge status: Home / Self Care | Payer: Self-pay | Source: Home / Self Care | Attending: Ophthalmology

## 2018-11-05 DIAGNOSIS — M1990 Unspecified osteoarthritis, unspecified site: Secondary | ICD-10-CM | POA: Diagnosis not present

## 2018-11-05 DIAGNOSIS — I10 Essential (primary) hypertension: Secondary | ICD-10-CM | POA: Insufficient documentation

## 2018-11-05 DIAGNOSIS — H2511 Age-related nuclear cataract, right eye: Secondary | ICD-10-CM | POA: Insufficient documentation

## 2018-11-05 DIAGNOSIS — Z87891 Personal history of nicotine dependence: Secondary | ICD-10-CM | POA: Insufficient documentation

## 2018-11-05 DIAGNOSIS — E7800 Pure hypercholesterolemia, unspecified: Secondary | ICD-10-CM | POA: Diagnosis not present

## 2018-11-05 DIAGNOSIS — Z79899 Other long term (current) drug therapy: Secondary | ICD-10-CM | POA: Insufficient documentation

## 2018-11-05 DIAGNOSIS — J449 Chronic obstructive pulmonary disease, unspecified: Secondary | ICD-10-CM | POA: Diagnosis not present

## 2018-11-05 DIAGNOSIS — K219 Gastro-esophageal reflux disease without esophagitis: Secondary | ICD-10-CM | POA: Diagnosis not present

## 2018-11-05 HISTORY — PX: CATARACT EXTRACTION W/PHACO: SHX586

## 2018-11-05 SURGERY — PHACOEMULSIFICATION, CATARACT, WITH IOL INSERTION
Anesthesia: Monitor Anesthesia Care | Site: Eye | Laterality: Right

## 2018-11-05 MED ORDER — BRIMONIDINE TARTRATE-TIMOLOL 0.2-0.5 % OP SOLN
OPHTHALMIC | Status: DC | PRN
  Administered 2018-11-05: 1 [drp] via OPHTHALMIC

## 2018-11-05 MED ORDER — FENTANYL CITRATE (PF) 100 MCG/2ML IJ SOLN
INTRAMUSCULAR | Status: DC | PRN
  Administered 2018-11-05: 50 ug via INTRAVENOUS

## 2018-11-05 MED ORDER — TETRACAINE HCL 0.5 % OP SOLN
1.0000 [drp] | OPHTHALMIC | Status: DC | PRN
  Administered 2018-11-05 (×3): 1 [drp] via OPHTHALMIC

## 2018-11-05 MED ORDER — MIDAZOLAM HCL 2 MG/2ML IJ SOLN
INTRAMUSCULAR | Status: DC | PRN
  Administered 2018-11-05 (×2): 1 mg via INTRAVENOUS

## 2018-11-05 MED ORDER — LIDOCAINE HCL (PF) 2 % IJ SOLN
INTRAOCULAR | Status: DC | PRN
  Administered 2018-11-05: 1 mL

## 2018-11-05 MED ORDER — NA CHONDROIT SULF-NA HYALURON 40-17 MG/ML IO SOLN
INTRAOCULAR | Status: DC | PRN
  Administered 2018-11-05: 1 mL via INTRAOCULAR

## 2018-11-05 MED ORDER — EPINEPHRINE PF 1 MG/ML IJ SOLN
INTRAOCULAR | Status: DC | PRN
  Administered 2018-11-05: 76 mL via OPHTHALMIC

## 2018-11-05 MED ORDER — ACETAMINOPHEN 160 MG/5ML PO SOLN: 325.0000 mg | Freq: Once | ORAL | Status: DC

## 2018-11-05 MED ORDER — LACTATED RINGERS IV SOLN: INTRAVENOUS | Status: DC

## 2018-11-05 MED ORDER — ACETAMINOPHEN 325 MG PO TABS: 325.0000 mg | ORAL_TABLET | Freq: Once | ORAL | Status: DC

## 2018-11-05 MED ORDER — ARMC OPHTHALMIC DILATING DROPS
1.0000 "application " | OPHTHALMIC | Status: DC | PRN
  Administered 2018-11-05 (×3): 1 via OPHTHALMIC

## 2018-11-05 MED ORDER — MOXIFLOXACIN HCL 0.5 % OP SOLN
OPHTHALMIC | Status: DC | PRN
  Administered 2018-11-05: 0.2 mL via OPHTHALMIC

## 2018-11-05 SURGICAL SUPPLY — 18 items
CANNULA ANT/CHMB 27GA (MISCELLANEOUS) ×6 IMPLANT
GLOVE SURG LX 8.0 MICRO (GLOVE) ×2
GLOVE SURG LX STRL 8.0 MICRO (GLOVE) ×1 IMPLANT
GLOVE SURG TRIUMPH 8.0 PF LTX (GLOVE) ×3 IMPLANT
GOWN STRL REUS W/ TWL LRG LVL3 (GOWN DISPOSABLE) ×2 IMPLANT
GOWN STRL REUS W/TWL LRG LVL3 (GOWN DISPOSABLE) ×4
LENS IOL TECNIS ITEC 20.0 (Intraocular Lens) ×3 IMPLANT
MARKER SKIN DUAL TIP RULER LAB (MISCELLANEOUS) ×3 IMPLANT
NDL RETROBULBAR .5 NSTRL (NEEDLE) ×3 IMPLANT
NEEDLE FILTER BLUNT 18X 1/2SAF (NEEDLE) ×2
NEEDLE FILTER BLUNT 18X1 1/2 (NEEDLE) ×1 IMPLANT
PACK EYE AFTER SURG (MISCELLANEOUS) ×3 IMPLANT
PACK OPTHALMIC (MISCELLANEOUS) ×3 IMPLANT
PACK PORFILIO (MISCELLANEOUS) ×3 IMPLANT
SYR 3ML LL SCALE MARK (SYRINGE) ×3 IMPLANT
SYR TB 1ML LUER SLIP (SYRINGE) ×3 IMPLANT
WATER STERILE IRR 250ML POUR (IV SOLUTION) ×3 IMPLANT
WIPE NON LINTING 3.25X3.25 (MISCELLANEOUS) ×3 IMPLANT

## 2018-11-05 NOTE — Transfer of Care (Signed)
Immediate Anesthesia Transfer of Care Note  Patient: Rebecca Hamilton  Procedure(s) Performed: CATARACT EXTRACTION PHACO AND INTRAOCULAR LENS PLACEMENT (IOC) - right  00:34  15.3%  5.32 (Right Eye)  Patient Location: PACU  Anesthesia Type: MAC  Level of Consciousness: awake, alert  and patient cooperative  Airway and Oxygen Therapy: Patient Spontanous Breathing and Patient connected to supplemental oxygen  Post-op Assessment: Post-op Vital signs reviewed, Patient's Cardiovascular Status Stable, Respiratory Function Stable, Patent Airway and No signs of Nausea or vomiting  Post-op Vital Signs: Reviewed and stable  Complications: No apparent anesthesia complications

## 2018-11-05 NOTE — Anesthesia Procedure Notes (Signed)
Procedure Name: MAC Date/Time: 11/05/2018 11:30 AM Performed by: Cameron Ali, CRNA Pre-anesthesia Checklist: Patient identified, Emergency Drugs available, Suction available, Timeout performed and Patient being monitored Patient Re-evaluated:Patient Re-evaluated prior to induction Oxygen Delivery Method: Nasal cannula Placement Confirmation: positive ETCO2

## 2018-11-05 NOTE — Anesthesia Preprocedure Evaluation (Signed)
Anesthesia Evaluation  Patient identified by MRN, date of birth, ID band Patient awake    Reviewed: Allergy & Precautions, H&P , NPO status , Patient's Chart, lab work & pertinent test results  Airway Mallampati: II  TM Distance: >3 FB Neck ROM: full    Dental no notable dental hx.    Pulmonary COPD,  COPD inhaler, former smoker,    Pulmonary exam normal breath sounds clear to auscultation       Cardiovascular hypertension, Normal cardiovascular exam Rhythm:regular Rate:Normal     Neuro/Psych    GI/Hepatic   Endo/Other    Renal/GU      Musculoskeletal   Abdominal   Peds  Hematology   Anesthesia Other Findings   Reproductive/Obstetrics                             Anesthesia Physical  Anesthesia Plan  ASA: II  Anesthesia Plan: MAC   Post-op Pain Management:    Induction:   PONV Risk Score and Plan: 2 and Midazolam, TIVA and Treatment may vary due to age or medical condition  Airway Management Planned:   Additional Equipment:   Intra-op Plan:   Post-operative Plan:   Informed Consent: I have reviewed the patients History and Physical, chart, labs and discussed the procedure including the risks, benefits and alternatives for the proposed anesthesia with the patient or authorized representative who has indicated his/her understanding and acceptance.       Plan Discussed with: CRNA  Anesthesia Plan Comments:         Anesthesia Quick Evaluation

## 2018-11-05 NOTE — H&P (Signed)
All labs reviewed. Abnormal studies sent to patients PCP when indicated.  Previous H&P reviewed, patient examined, there are NO CHANGES.  Rebecca Byas Porfilio8/25/202011:20 AM

## 2018-11-05 NOTE — Op Note (Signed)
PREOPERATIVE DIAGNOSIS:  Nuclear sclerotic cataract of the right eye.   POSTOPERATIVE DIAGNOSIS:  H25.11 - Catract   OPERATIVE PROCEDURE: Procedure(s): CATARACT EXTRACTION PHACO AND INTRAOCULAR LENS PLACEMENT (IOC) - right  00:34  15.3%  5.32   SURGEON:  Birder Robson, MD.   ANESTHESIA:  Anesthesiologist: Ronelle Nigh, MD CRNA: Cameron Ali, CRNA  1.      Managed anesthesia care. 2.      0.19ml of Shugarcaine was instilled in the eye following the paracentesis.   COMPLICATIONS:  None.   TECHNIQUE:   Stop and chop   DESCRIPTION OF PROCEDURE:  The patient was examined and consented in the preoperative holding area where the aforementioned topical anesthesia was applied to the right eye and then brought back to the Operating Room where the right eye was prepped and draped in the usual sterile ophthalmic fashion and a lid speculum was placed. A paracentesis was created with the side port blade and the anterior chamber was filled with viscoelastic. A near clear corneal incision was performed with the steel keratome. A continuous curvilinear capsulorrhexis was performed with a cystotome followed by the capsulorrhexis forceps. Hydrodissection and hydrodelineation were carried out with BSS on a blunt cannula. The lens was removed in a stop and chop  technique and the remaining cortical material was removed with the irrigation-aspiration handpiece. The capsular bag was inflated with viscoelastic and the Technis [id number]  lens was placed in the capsular bag without complication. The remaining viscoelastic was removed from the eye with the irrigation-aspiration handpiece. The wounds were hydrated. The anterior chamber was flushed with BSS  and the eye was inflated to physiologic pressure. 0.83ml of Vigamox was placed in the anterior chamber. The wounds were found to be water tight. The eye was dressed with Combigan. The patient was given protective glasses to wear throughout the day and a shield with  which to sleep tonight. The patient was also given drops with which to begin a drop regimen today and will follow-up with me in one day. Implant Name Type Inv. Item Serial No. Manufacturer Lot No. LRB No. Used Action  LENS IOL DIOP 20.0 - [id number] Intraocular Lens LENS IOL DIOP 20.0 [id number] AMO  Right 1 Implanted   Procedure(s): CATARACT EXTRACTION PHACO AND INTRAOCULAR LENS PLACEMENT (IOC) - right  00:34  15.3%  5.32 (Right)  Electronically signed: Birder Robson 11/05/2018 11:49 AM

## 2018-11-05 NOTE — Anesthesia Postprocedure Evaluation (Signed)
Anesthesia Post Note  Patient: Rebecca Hamilton  Procedure(s) Performed: CATARACT EXTRACTION PHACO AND INTRAOCULAR LENS PLACEMENT (IOC) - right  00:34  15.3%  5.32 (Right Eye)  Patient location during evaluation: PACU Anesthesia Type: MAC Level of consciousness: awake and alert and oriented Pain management: satisfactory to patient Vital Signs Assessment: post-procedure vital signs reviewed and stable Respiratory status: spontaneous breathing, nonlabored ventilation and respiratory function stable Cardiovascular status: blood pressure returned to baseline and stable Postop Assessment: Adequate PO intake and No signs of nausea or vomiting Anesthetic complications: no    Raliegh Ip

## 2018-11-06 ENCOUNTER — Encounter: Payer: Self-pay | Admitting: Ophthalmology

## 2018-12-10 ENCOUNTER — Other Ambulatory Visit: Payer: Self-pay | Admitting: Family Medicine

## 2019-01-07 ENCOUNTER — Ambulatory Visit (INDEPENDENT_AMBULATORY_CARE_PROVIDER_SITE_OTHER): Payer: Medicare Other

## 2019-01-07 DIAGNOSIS — Z23 Encounter for immunization: Secondary | ICD-10-CM

## 2019-03-03 ENCOUNTER — Other Ambulatory Visit: Payer: Self-pay | Admitting: Family Medicine

## 2019-03-03 MED ORDER — AMLODIPINE BESYLATE 5 MG PO TABS: 5.0000 mg | ORAL_TABLET | Freq: Every day | ORAL | 0 refills | Status: DC

## 2019-03-03 NOTE — Addendum Note (Signed)
Addended by: Carter Kitten on: 03/03/2019 10:10 AM   Modules accepted: Orders

## 2019-03-03 NOTE — Telephone Encounter (Signed)
Please schedule MWV with nurse and CPE with Dr. Lorelei Pont.  I think she is due the end of January.

## 2019-03-10 ENCOUNTER — Other Ambulatory Visit: Payer: Self-pay | Admitting: Family Medicine

## 2019-03-10 DIAGNOSIS — Z1231 Encounter for screening mammogram for malignant neoplasm of breast: Secondary | ICD-10-CM

## 2019-03-20 ENCOUNTER — Telehealth: Payer: Self-pay | Admitting: Family Medicine

## 2019-03-20 NOTE — Telephone Encounter (Signed)
Please schedule MWV with Nurse and CPE with Dr. Patsy Lager for sometime early February.

## 2019-03-21 NOTE — Telephone Encounter (Signed)
Medicare wellness /labs 2/5 cpx 2/10 Pt aware

## 2019-04-01 ENCOUNTER — Other Ambulatory Visit: Payer: Self-pay | Admitting: Family Medicine

## 2019-04-08 ENCOUNTER — Ambulatory Visit: Payer: Medicare Other

## 2019-04-14 ENCOUNTER — Other Ambulatory Visit: Payer: Self-pay | Admitting: Family Medicine

## 2019-04-14 DIAGNOSIS — E559 Vitamin D deficiency, unspecified: Secondary | ICD-10-CM

## 2019-04-14 DIAGNOSIS — Z79899 Other long term (current) drug therapy: Secondary | ICD-10-CM

## 2019-04-14 DIAGNOSIS — E785 Hyperlipidemia, unspecified: Secondary | ICD-10-CM

## 2019-04-15 ENCOUNTER — Ambulatory Visit
Admission: RE | Admit: 2019-04-15 | Discharge: 2019-04-15 | Disposition: A | Discharge status: Home / Self Care | Payer: Medicare PPO | Source: Ambulatory Visit | Attending: Family Medicine | Admitting: Family Medicine

## 2019-04-15 DIAGNOSIS — Z1231 Encounter for screening mammogram for malignant neoplasm of breast: Secondary | ICD-10-CM | POA: Diagnosis not present

## 2019-04-18 ENCOUNTER — Other Ambulatory Visit (INDEPENDENT_AMBULATORY_CARE_PROVIDER_SITE_OTHER): Payer: Medicare PPO

## 2019-04-18 ENCOUNTER — Other Ambulatory Visit: Payer: Self-pay

## 2019-04-18 ENCOUNTER — Ambulatory Visit (INDEPENDENT_AMBULATORY_CARE_PROVIDER_SITE_OTHER): Payer: Medicare PPO

## 2019-04-18 DIAGNOSIS — Z0000 Encounter for general adult medical examination without abnormal findings: Secondary | ICD-10-CM

## 2019-04-18 DIAGNOSIS — Z79899 Other long term (current) drug therapy: Secondary | ICD-10-CM

## 2019-04-18 DIAGNOSIS — E785 Hyperlipidemia, unspecified: Secondary | ICD-10-CM | POA: Diagnosis not present

## 2019-04-18 DIAGNOSIS — E559 Vitamin D deficiency, unspecified: Secondary | ICD-10-CM | POA: Diagnosis not present

## 2019-04-18 LAB — CBC WITH DIFFERENTIAL/PLATELET
Basophils Absolute: 0 10*3/uL (ref 0.0–0.1)
Basophils Relative: 0.9 % (ref 0.0–3.0)
Eosinophils Absolute: 0.1 10*3/uL (ref 0.0–0.7)
Eosinophils Relative: 2.2 % (ref 0.0–5.0)
HCT: 40.8 % (ref 36.0–46.0)
Hemoglobin: 13.4 g/dL (ref 12.0–15.0)
Lymphocytes Relative: 24.1 % (ref 12.0–46.0)
Lymphs Abs: 1.3 10*3/uL (ref 0.7–4.0)
MCHC: 32.9 g/dL (ref 30.0–36.0)
MCV: 91.2 fl (ref 78.0–100.0)
Monocytes Absolute: 0.4 10*3/uL (ref 0.1–1.0)
Monocytes Relative: 7.8 % (ref 3.0–12.0)
Neutro Abs: 3.4 10*3/uL (ref 1.4–7.7)
Neutrophils Relative %: 65 % (ref 43.0–77.0)
Platelets: 237 10*3/uL (ref 150.0–400.0)
RBC: 4.48 Mil/uL (ref 3.87–5.11)
RDW: 13.4 % (ref 11.5–15.5)
WBC: 5.2 10*3/uL (ref 4.0–10.5)

## 2019-04-18 LAB — BASIC METABOLIC PANEL
BUN: 14 mg/dL (ref 6–23)
CO2: 27 mEq/L (ref 19–32)
Calcium: 10.4 mg/dL (ref 8.4–10.5)
Chloride: 101 mEq/L (ref 96–112)
Creatinine, Ser: 0.87 mg/dL (ref 0.40–1.20)
GFR: 62.38 mL/min (ref >60.00)
Glucose, Bld: 98 mg/dL (ref 70–99)
Potassium: 4.4 mEq/L (ref 3.5–5.1)
Sodium: 136 mEq/L (ref 135–145)

## 2019-04-18 LAB — LIPID PANEL
Cholesterol: 144 mg/dL (ref 0–200)
HDL: 68.4 mg/dL (ref >39.00)
LDL Cholesterol: 67 mg/dL (ref 0–99)
NonHDL: 75.96
Total CHOL/HDL Ratio: 2
Triglycerides: 46 mg/dL (ref 0.0–149.0)
VLDL: 9.2 mg/dL (ref 0.0–40.0)

## 2019-04-18 LAB — HEPATIC FUNCTION PANEL
ALT: 22 U/L (ref 0–35)
AST: 24 U/L (ref 0–37)
Albumin: 4.1 g/dL (ref 3.5–5.2)
Alkaline Phosphatase: 68 U/L (ref 39–117)
Bilirubin, Direct: 0.1 mg/dL (ref 0.0–0.3)
Total Bilirubin: 0.5 mg/dL (ref 0.2–1.2)
Total Protein: 7.1 g/dL (ref 6.0–8.3)

## 2019-04-18 LAB — VITAMIN D 25 HYDROXY (VIT D DEFICIENCY, FRACTURES): VITD: 66.4 ng/mL (ref 30.00–100.00)

## 2019-04-18 LAB — TSH: TSH: 1.93 u[IU]/mL (ref 0.35–4.50)

## 2019-04-18 NOTE — Progress Notes (Signed)
Subjective:   KEONA BILYEU is a 84 y.o. female who presents for Medicare Annual (Subsequent) preventive examination.  Review of Systems: N/A   This visit is being conducted through telemedicine via telephone at the nurse health advisor's home address due to the COVID-19 pandemic. This patient has given me verbal consent via doximity to conduct this visit, patient states they are participating from their home address. Patient and myself are on the telephone call. There is no referral for this visit. Some vital signs may be absent or patient reported.    Patient identification: identified by name, DOB, and current address   Cardiac Risk Factors include: advanced age (>88men, >10 women);hypertension;dyslipidemia     Objective:     Vitals: There were no vitals taken for this visit.  There is no height or weight on file to calculate BMI.  Advanced Directives 04/18/2019 11/05/2018 10/08/2018  Does Patient Have a Medical Advance Directive? Yes Yes -  Type of Estate agent of [place];Living will Healthcare Power of [place];Living will Healthcare Power of [place];Living will  Does patient want to make changes to medical advance directive? - No - Patient declined No - Patient declined  Copy of Healthcare Power of Attorney in Chart? Yes - validated most recent copy scanned in chart (See row information) No - copy requested Yes - validated most recent copy scanned in chart (See row information)    Tobacco Social History   Tobacco Use  Smoking Status Former Smoker  . Quit date: 1  . Years since quitting: 33.1  Smokeless Tobacco Never Used  Tobacco Comment   quit 1988     Counseling given: Not Answered Comment: quit 1988   Clinical Intake:  Pre-visit preparation completed: Yes  Pain : No/denies pain     Nutritional Risks: None Diabetes: No  How often do you need to have someone help you when you read instructions, pamphlets, or other written  materials from your doctor or pharmacy?: 1 - Never What is the last grade level you completed in school?: Doctorate  Interpreter Needed?: No  Information entered by :: CJohnson, LPN  Past Medical History:  Diagnosis Date  . Colon polyps   . COPD (chronic obstructive pulmonary disease) (HCC)   . GERD (gastroesophageal reflux disease)   . HTN (hypertension)   . Hyperlipidemia   . Osteopenia    Past Surgical History:  Procedure Laterality Date  . CATARACT EXTRACTION W/PHACO Left 10/08/2018   Procedure: CATARACT EXTRACTION PHACO AND INTRAOCULAR LENS PLACEMENT (IOC) LEFT;  Surgeon: Galen Manila, MD;  [facility];  Service: Ophthalmology;  Laterality: Left;  . CATARACT EXTRACTION W/PHACO Right 11/05/2018   Procedure: CATARACT EXTRACTION PHACO AND INTRAOCULAR LENS PLACEMENT (IOC) - right  00:34  15.3%  5.32;  Surgeon: Galen Manila, MD;  Location: [facility];  Service: Ophthalmology;  Laterality: Right;  . COLONOSCOPY    . DENTAL SURGERY    . EYE SURGERY     Family History  Problem Relation Age of Onset  . Diabetes Father   . Heart disease Father   . Diabetes Brother   . Heart disease Mother   . Breast cancer Neg Hx    Social History   Socioeconomic History  . Marital status: Married    Spouse name: Not on file  . Number of children: 2  . Years of education: Not on file  . Highest education level: Not on file  Occupational History  . Occupation: retired  Employer: retired  Tobacco Use  . Smoking status: Former Smoker    Quit date: 1988    Years since quitting: 33.1  . Smokeless tobacco: Never Used  . Tobacco comment: quit 1988  Substance and Sexual Activity  . Alcohol use: Yes    Alcohol/week: 2.0 standard drinks    Types: 1 Glasses of wine, 1 Shots of liquor per week    Comment: daily  . Drug use: No  . Sexual activity: Not on file  Other Topics Concern  . Not on file  Social History Narrative   Regular exercise--no   Social  Determinants of Health   Financial Resource Strain: Low Risk   . Difficulty of Paying Living Expenses: Not hard at all  Food Insecurity: No Food Insecurity  . Worried About Programme researcher, broadcasting/film/video in the Last Year: Never true  . Ran Out of Food in the Last Year: Never true  Transportation Needs: No Transportation Needs  . Lack of Transportation (Medical): No  . Lack of Transportation (Non-Medical): No  Physical Activity: Inactive  . Days of Exercise per Week: 0 days  . Minutes of Exercise per Session: 0 min  Stress: No Stress Concern Present  . Feeling of Stress : Not at all  Social Connections:   . Frequency of Communication with Friends and Family: Not on file  . Frequency of Social Gatherings with Friends and Family: Not on file  . Attends Religious Services: Not on file  . Active Member of Clubs or Organizations: Not on file  . Attends Banker Meetings: Not on file  . Marital Status: Not on file    Outpatient Encounter Medications as of 04/18/2019  Medication Sig  . albuterol (PROVENTIL HFA;VENTOLIN HFA) 108 (90 Base) MCG/ACT inhaler Inhale 2 puffs into the lungs every 6 (six) hours as needed for wheezing.  Marland Kitchen amLODipine (NORVASC) 5 MG tablet Take 1 tablet (5 mg total) by mouth daily.  Marland Kitchen atorvastatin (LIPITOR) 10 MG tablet TAKE 1 TABLET BY MOUTH ONCE DAILY  . Calcium Carb-Cholecalciferol (CALCIUM 600 + D PO) Take by mouth daily.  . chlorpheniramine-HYDROcodone (TUSSIONEX PENNKINETIC ER) 10-8 MG/5ML SUER Take 5 mLs by mouth.  . Cholecalciferol (VITAMIN D) 2000 UNITS CAPS Take by mouth 2 (two) times daily.  . dorzolamide (TRUSOPT) 2 % ophthalmic solution Place 1 drop into the right eye 2 (two) times daily.  . Glucosamine-Chondroitin (GLUCOSAMINE CHONDR COMPLEX PO) Take 1 tablet by mouth daily.  . hydrochlorothiazide (HYDRODIURIL) 12.5 MG tablet Take 12.5 mg by mouth daily.  . hydrochlorothiazide (MICROZIDE) 12.5 MG capsule TAKE 1 TABLET BY MOUTH ONCE A DAY  . latanoprost  (XALATAN) 0.005 % ophthalmic solution   . losartan (COZAAR) 50 MG tablet TAKE 1 TABLET BY MOUTH ONCE A DAY  . Multiple Vitamins-Minerals (ICAPS AREDS 2) CAPS Take 1 each by mouth 2 (two) times daily.  Marland Kitchen tiotropium (SPIRIVA HANDIHALER) 18 MCG inhalation capsule INHALE CONTENTS OF ONE CAPSULES ONCE DAILY   No facility-administered encounter medications on file as of 04/18/2019.    Activities of Daily Living In your present state of health, do you have any difficulty performing the following activities: 04/18/2019 11/05/2018  Hearing? Y N  Comment wears hearing aids -  Vision? N N  Difficulty concentrating or making decisions? N N  Walking or climbing stairs? N N  Dressing or bathing? N N  Doing errands, shopping? N -  Preparing Food and eating ? N -  Using the Toilet? N -  In  the past six months, have you accidently leaked urine? N -  Do you have problems with loss of bowel control? N -  Managing your Medications? N -  Managing your Finances? N -  Housekeeping or managing your Housekeeping? N -  Some recent data might be hidden    Patient Care Team: Hannah Beat, MD as PCP - General Hannah Beat, MD    Assessment:   This is a routine wellness examination for Sada.  Exercise Activities and Dietary recommendations Current Exercise Habits: Home exercise routine, Type of exercise: stretching, Time (Minutes): 30, Frequency (Times/Week): 7, Weekly Exercise (Minutes/Week): 210, Intensity: Mild, Exercise limited by: None identified  Goals    . Patient Stated     04/18/2019, I will maintain and stay healthy. Very anxious to get back on the golf course.       Fall Risk Fall Risk  04/18/2019 04/10/2018 04/09/2017 03/07/2016 01/18/2015  Falls in the past year? 1 1 No No Yes  Comment tripped on crack in driveway - - - -  Number falls in past yr: 0 1 - - 2 or more  Injury with Fall? 0 0 - - No  Risk for fall due to : History of fall(s);Medication side effect - - - History of fall(s)    Follow up Falls evaluation completed;Falls prevention discussed - - - -   Is the patient's home free of loose throw rugs in walkways, pet beds, electrical cords, etc?   yes      Grab bars in the bathroom? yes      Handrails on the stairs?   yes      Adequate lighting?   yes  Timed Get Up and Go performed: N/A  Depression Screen PHQ 2/9 Scores 04/18/2019 04/10/2018 04/09/2017 03/07/2016  PHQ - 2 Score 1 0 0 0  PHQ- 9 Score 1 - - -     Cognitive Function MMSE - Mini Mental State Exam 04/18/2019  Orientation to time 5  Orientation to Place 5  Registration 3  Attention/ Calculation 5  Recall 3  Language- repeat 1       Mini Cog  Mini-Cog screen was completed. Maximum score is 22. A value of 0 denotes this part of the MMSE was not completed or the patient failed this part of the Mini-Cog screening.  Immunization History  Administered Date(s) Administered  . Fluad Quad(high Dose 65+) 01/07/2019  . Hepatitis A 06/26/1995, 12/26/1995  . Hepatitis B 12/01/2005, 01/01/2006, 07/06/2006  . Influenza Split 12/29/2011  . Influenza Whole 03/14/2007  . Influenza,inj,Quad PF,6+ Mos 12/03/2013, 01/11/2015, 01/18/2016, 01/04/2017, 12/24/2017  . Influenza-Unspecified 12/11/2012  . Pneumococcal Conjugate-13 12/03/2013  . Pneumococcal Polysaccharide-23 03/13/2005, 12/01/2005  . Td 03/13/2005, 12/01/2005  . Zoster 07/06/2006  . Zoster Recombinat (Shingrix) 12/27/2017, 03/25/2018    Qualifies for Shingles Vaccine? Completed series   Screening Tests Health Maintenance  Topic Date Due  . TETANUS/TDAP  04/17/2020 (Originally 12/02/2015)  . MAMMOGRAM  04/14/2020  . INFLUENZA VACCINE  Completed  . DEXA SCAN  Completed  . PNA vac Low Risk Adult  Completed    Cancer Screenings: Lung: Low Dose CT Chest recommended if Age 46-80 years, 30 pack-year currently smoking OR have quit w/in 15years. Patient does not qualify. Breast:  Up to date on Mammogram: Yes, completed 04/15/2019  Bone  Density/Dexa: completed 11/08/2010 Colorectal: completed 05/14/2018  Additional Screenings:  Hepatitis C Screening: N/A     Plan:    Patient will maintain and stay healthy.  I have personally reviewed and noted the following in the patient's chart:   . Medical and social history . Use of alcohol, tobacco or illicit drugs  . Current medications and supplements . Functional ability and status . Nutritional status . Physical activity . Advanced directives . List of other physicians . Hospitalizations, surgeries, and ER visits in previous 12 months . Vitals . Screenings to include cognitive, depression, and falls . Referrals and appointments  In addition, I have reviewed and discussed with patient certain preventive protocols, quality metrics, and best practice recommendations. A written personalized care plan for preventive services as well as general preventive health recommendations were provided to patient.     Andrez Grime, LPN  03/15/2447

## 2019-04-18 NOTE — Progress Notes (Signed)
PCP notes:  Health Maintenance: Tdap- insurance/financial   Abnormal Screenings: none   Patient concerns: none   Nurse concerns: none   Next PCP appt.: 04/23/2019 @ 9 am

## 2019-04-18 NOTE — Patient Instructions (Signed)
Rebecca Hamilton , Thank you for taking time to come for your Medicare Wellness Visit. I appreciate your ongoing commitment to your health goals. Please review the following plan we discussed and let me know if I can assist you in the future.   Screening recommendations/referrals: Colonoscopy: Up to date, completed 05/14/2018 Mammogram: Up to date, completed 04/15/2019 Bone Density: completed 11/08/2010 Recommended yearly ophthalmology/optometry visit for glaucoma screening and checkup Recommended yearly dental visit for hygiene and checkup  Vaccinations: Influenza vaccine: Up to date, completed 01/07/2019 Pneumococcal vaccine: Completed series Tdap vaccine: decline Shingles vaccine: Completed series    Advanced directives: copy in chart  Conditions/risks identified: hypertension, hyperlipidemia  Next appointment: 04/23/2019 @ 9 am    Preventive Care 82 Years and Older, Female Preventive care refers to lifestyle choices and visits with your health care provider that can promote health and wellness. What does preventive care include?  A yearly physical exam. This is also called an annual well check.  Dental exams once or twice a year.  Routine eye exams. Ask your health care provider how often you should have your eyes checked.  Personal lifestyle choices, including:  Daily care of your teeth and gums.  Regular physical activity.  Eating a healthy diet.  Avoiding tobacco and drug use.  Limiting alcohol use.  Practicing safe sex.  Taking low-dose aspirin every day.  Taking vitamin and mineral supplements as recommended by your health care provider. What happens during an annual well check? The services and screenings done by your health care provider during your annual well check will depend on your age, overall health, lifestyle risk factors, and family history of disease. Counseling  Your health care provider may ask you questions about your:  Alcohol use.  Tobacco  use.  Drug use.  Emotional well-being.  Home and relationship well-being.  Sexual activity.  Eating habits.  History of falls.  Memory and ability to understand (cognition).  Work and work Astronomer.  Reproductive health. Screening  You may have the following tests or measurements:  Height, weight, and BMI.  Blood pressure.  Lipid and cholesterol levels. These may be checked every 5 years, or more frequently if you are over 19 years old.  Skin check.  Lung cancer screening. You may have this screening every year starting at age 82 if you have a 30-pack-year history of smoking and currently smoke or have quit within the past 15 years.  Fecal occult blood test (FOBT) of the stool. You may have this test every year starting at age 82.  Flexible sigmoidoscopy or colonoscopy. You may have a sigmoidoscopy every 5 years or a colonoscopy every 10 years starting at age 82.  Hepatitis C blood test.  Hepatitis B blood test.  Sexually transmitted disease (STD) testing.  Diabetes screening. This is done by checking your blood sugar (glucose) after you have not eaten for a while (fasting). You may have this done every 1-3 years.  Bone density scan. This is done to screen for osteoporosis. You may have this done starting at age 82.  Mammogram. This may be done every 1-2 years. Talk to your health care provider about how often you should have regular mammograms. Talk with your health care provider about your test results, treatment options, and if necessary, the need for more tests. Vaccines  Your health care provider may recommend certain vaccines, such as:  Influenza vaccine. This is recommended every year.  Tetanus, diphtheria, and acellular pertussis (Tdap, Td) vaccine. You may need a  Td booster every 10 years.  Zoster vaccine. You may need this after age 82.  Pneumococcal 13-valent conjugate (PCV13) vaccine. One dose is recommended after age 78.  Pneumococcal  polysaccharide (PPSV23) vaccine. One dose is recommended after age 81. Talk to your health care provider about which screenings and vaccines you need and how often you need them. This information is not intended to replace advice given to you by your health care provider. Make sure you discuss any questions you have with your health care provider. Document Released: 03/26/2015 Document Revised: 11/17/2015 Document Reviewed: 12/29/2014 Elsevier Interactive Patient Education  2017 [place] Prevention in the Home Falls can cause injuries. They can happen to people of all ages. There are many things you can do to make your home safe and to help prevent falls. What can I do on the outside of my home?  Regularly fix the edges of walkways and driveways and fix any cracks.  Remove anything that might make you trip as you walk through a door, such as a raised step or threshold.  Trim any bushes or trees on the path to your home.  Use bright outdoor lighting.  Clear any walking paths of anything that might make someone trip, such as rocks or tools.  Regularly check to see if handrails are loose or broken. Make sure that both sides of any steps have handrails.  Any raised decks and porches should have guardrails on the edges.  Have any leaves, snow, or ice cleared regularly.  Use sand or salt on walking paths during winter.  Clean up any spills in your garage right away. This includes oil or grease spills. What can I do in the bathroom?  Use night lights.  Install grab bars by the toilet and in the tub and shower. Do not use towel bars as grab bars.  Use non-skid mats or decals in the tub or shower.  If you need to sit down in the shower, use a plastic, non-slip stool.  Keep the floor dry. Clean up any water that spills on the floor as soon as it happens.  Remove soap buildup in the tub or shower regularly.  Attach bath mats securely with double-sided non-slip rug  tape.  Do not have throw rugs and other things on the floor that can make you trip. What can I do in the bedroom?  Use night lights.  Make sure that you have a light by your bed that is easy to reach.  Do not use any sheets or blankets that are too big for your bed. They should not hang down onto the floor.  Have a firm chair that has side arms. You can use this for support while you get dressed.  Do not have throw rugs and other things on the floor that can make you trip. What can I do in the kitchen?  Clean up any spills right away.  Avoid walking on wet floors.  Keep items that you use a lot in easy-to-reach places.  If you need to reach something above you, use a strong step stool that has a grab bar.  Keep electrical cords out of the way.  Do not use floor polish or wax that makes floors slippery. If you must use wax, use non-skid floor wax.  Do not have throw rugs and other things on the floor that can make you trip. What can I do with my stairs?  Do not leave any items on the stairs.  Make sure that there are handrails on both sides of the stairs and use them. Fix handrails that are broken or loose. Make sure that handrails are as long as the stairways.  Check any carpeting to make sure that it is firmly attached to the stairs. Fix any carpet that is loose or worn.  Avoid having throw rugs at the top or bottom of the stairs. If you do have throw rugs, attach them to the floor with carpet tape.  Make sure that you have a light switch at the top of the stairs and the bottom of the stairs. If you do not have them, ask someone to add them for you. What else can I do to help prevent falls?  Wear shoes that:  Do not have high heels.  Have rubber bottoms.  Are comfortable and fit you well.  Are closed at the toe. Do not wear sandals.  If you use a stepladder:  Make sure that it is fully opened. Do not climb a closed stepladder.  Make sure that both sides of the  stepladder are locked into place.  Ask someone to hold it for you, if possible.  Clearly mark and make sure that you can see:  Any grab bars or handrails.  First and last steps.  Where the edge of each step is.  Use tools that help you move around (mobility aids) if they are needed. These include:  Canes.  Walkers.  Scooters.  Crutches.  Turn on the lights when you go into a dark area. Replace any light bulbs as soon as they burn out.  Set up your furniture so you have a clear path. Avoid moving your furniture around.  If any of your floors are uneven, fix them.  If there are any pets around you, be aware of where they are.  Review your medicines with your doctor. Some medicines can make you feel dizzy. This can increase your chance of falling. Ask your doctor what other things that you can do to help prevent falls. This information is not intended to replace advice given to you by your health care provider. Make sure you discuss any questions you have with your health care provider. Document Released: 12/24/2008 Document Revised: 08/05/2015 Document Reviewed: 04/03/2014 Elsevier Interactive Patient Education  2017 [employer].

## 2019-04-23 ENCOUNTER — Other Ambulatory Visit: Payer: Self-pay

## 2019-04-23 ENCOUNTER — Ambulatory Visit (INDEPENDENT_AMBULATORY_CARE_PROVIDER_SITE_OTHER): Payer: Medicare PPO | Admitting: Family Medicine

## 2019-04-23 ENCOUNTER — Encounter: Payer: Self-pay | Admitting: Family Medicine

## 2019-04-23 VITALS — BP 110/72 | HR 74 | Temp 97.9°F | Ht 61.5 in | Wt 147.5 lb

## 2019-04-23 DIAGNOSIS — Z0000 Encounter for general adult medical examination without abnormal findings: Secondary | ICD-10-CM

## 2019-04-23 NOTE — Progress Notes (Signed)
Shakim Faith T. Dua Mehler, MD Primary Care and Sports Medicine [facility] [address] [place], [address] Phone: [phone]  FAX: [phone]  Rebecca Hamilton - 84 y.o. female  MRN [id number]  Date of Birth: Sep 24, 1935  Visit Date: 04/23/2019  PCP: Hannah Beat, MD  Referred by: Hannah Beat, MD  No chief complaint on file.   This visit occurred during the SARS-CoV-2 public health emergency.  Safety protocols were in place, including screening questions prior to the visit, additional usage of staff PPE, and extensive cleaning of exam room while observing appropriate contact time as indicated for disinfecting solutions.   Patient Care Team: Hannah Beat, MD as PCP - General Tieasha Larsen, Karleen Hampshire, MD Subjective:   Rebecca Hamilton is a 84 y.o. pleasant patient who presents with the following:  Health Maintenance Summary Reviewed and updated, unless pt declines services.  Tobacco History Reviewed. Non-smoker Alcohol: No concerns, no excessive use. 2 drinks a day Exercise Habits: Some activity, rec at least 30 mins 5 times a week STD concerns: none Drug Use: None Lumps or breast concerns: no  mammo just done  COVID-19 VAC  Exercise - walking some on the golf course.  Some work at home.  Has not made her herself.   Health Maintenance  Topic Date Due  . TETANUS/TDAP  04/17/2020 (Originally 12/02/2015)  . MAMMOGRAM  04/14/2020  . INFLUENZA VACCINE  Completed  . DEXA SCAN  Completed  . PNA vac Low Risk Adult  Completed    Immunization History  Administered Date(s) Administered  . Fluad Quad(high Dose 65+) 01/07/2019  . Hepatitis A 06/26/1995, 12/26/1995  . Hepatitis B 12/01/2005, 01/01/2006, 07/06/2006  . Influenza Split 12/29/2011  . Influenza Whole 03/14/2007  . Influenza,inj,Quad PF,6+ Mos 12/03/2013, 01/11/2015, 01/18/2016, 01/04/2017, 12/24/2017  . Influenza-Unspecified 12/11/2012  . Pneumococcal Conjugate-13  12/03/2013  . Pneumococcal Polysaccharide-23 03/13/2005, 12/01/2005  . Td 03/13/2005, 12/01/2005  . Zoster 07/06/2006  . Zoster Recombinat (Shingrix) 12/27/2017, 03/25/2018   Patient Active Problem List   Diagnosis Date Noted  . History of abnormal Pap smear 10/11/2010  . FATIGUE 09/01/2009  . OSTEOPENIA 06/25/2008  . Hyperlipidemia 05/22/2008  . UNSPECIFIED ANEMIA 05/22/2008  . HYPERTENSION 05/22/2008  . COPD exacerbation (HCC) 05/22/2008  . GERD 05/22/2008  . COLONIC POLYPS, HX OF 05/22/2008    Past Medical History:  Diagnosis Date  . Colon polyps   . COPD (chronic obstructive pulmonary disease) (HCC)   . GERD (gastroesophageal reflux disease)   . HTN (hypertension)   . Hyperlipidemia   . Osteopenia     Past Surgical History:  Procedure Laterality Date  . CATARACT EXTRACTION W/PHACO Left 10/08/2018   Procedure: CATARACT EXTRACTION PHACO AND INTRAOCULAR LENS PLACEMENT (IOC) LEFT;  Surgeon: Galen Manila, MD;  Location[facility];  Service: Ophthalmology;  Laterality: Left;  . CATARACT EXTRACTION W/PHACO Right 11/05/2018   Procedure: CATARACT EXTRACTION PHACO AND INTRAOCULAR LENS PLACEMENT (IOC) - right  00:34  15.3%  5.32;  Surgeon: Galen Manila, MD;  Location: [facility];  Service: Ophthalmology;  Laterality: Right;  . COLONOSCOPY    . DENTAL SURGERY    . EYE SURGERY      Family History  Problem Relation Age of Onset  . Diabetes Father   . Heart disease Father   . Diabetes Brother   . Heart disease Mother   . Breast cancer Neg Hx     Past Medical History, Surgical History, Social History, Family History, Problem  List, Medications, and Allergies have been reviewed and updated if relevant.  Review of Systems: Pertinent positives are listed above.  Otherwise, a full 14 point review of systems has been done in full and it is negative except where it is noted positive.  Objective:   There were no vitals taken for this visit. Ideal Body  Weight:   No exam data present Depression screen [facility] 2/9 04/18/2019 04/10/2018 04/09/2017 03/07/2016 01/18/2015  Decreased Interest 0 0 0 0 0  Down, Depressed, Hopeless 1 0 0 0 0  PHQ - 2 Score 1 0 0 0 0  Altered sleeping 0 - - - -  Tired, decreased energy 0 - - - -  Change in appetite 0 - - - -  Feeling bad or failure about yourself  0 - - - -  Trouble concentrating 0 - - - -  Moving slowly or fidgety/restless 0 - - - -  Suicidal thoughts 0 - - - -  PHQ-9 Score 1 - - - -  Difficult doing work/chores Not difficult at all - - - -     GEN: well developed, well nourished, no acute distress Eyes: conjunctiva and lids normal, PERRLA, EOMI ENT: TM clear, nares clear, oral exam WNL Neck: supple, no lymphadenopathy, no thyromegaly, no JVD Pulm: clear to auscultation and percussion, respiratory effort normal CV: regular rate and rhythm, S1-S2, no murmur, rub or gallop, no bruits Chest: no scars, masses, no lumps BREAST: breast exam declined GI: soft, non-tender; no hepatosplenomegaly, masses; active bowel sounds all quadrants GU: GU exam declined Lymph: no cervical, axillary or inguinal adenopathy MSK: gait normal, muscle tone and strength WNL, no joint swelling, effusions, discoloration, crepitus  SKIN: clear, good turgor, color WNL, no rashes, lesions, or ulcerations Neuro: normal mental status, normal strength, sensation, and motion Psych: alert; oriented to person, place and time, normally interactive and not anxious or depressed in appearance.   All labs reviewed with patient. Results for orders placed or performed in visit on 04/18/19  VITAMIN D 25 Hydroxy (Vit-D Deficiency, Fractures)  Result Value Ref Range   VITD 66.40 30.00 - 100.00 ng/mL  TSH  Result Value Ref Range   TSH 1.93 0.35 - 4.50 uIU/mL  Basic metabolic panel  Result Value Ref Range   Sodium 136 135 - 145 mEq/L   Potassium 4.4 3.5 - 5.1 mEq/L   Chloride 101 96 - 112 mEq/L   CO2 27 19 - 32 mEq/L   Glucose, Bld 98  70 - 99 mg/dL   BUN 14 6 - 23 mg/dL   Creatinine, Ser 1.24 0.40 - 1.20 mg/dL   GFR 58.09 >98.33 mL/min   Calcium 10.4 8.4 - 10.5 mg/dL  Hepatic function panel  Result Value Ref Range   Total Bilirubin 0.5 0.2 - 1.2 mg/dL   Bilirubin, Direct 0.1 0.0 - 0.3 mg/dL   Alkaline Phosphatase 68 39 - 117 U/L   AST 24 0 - 37 U/L   ALT 22 0 - 35 U/L   Total Protein 7.1 6.0 - 8.3 g/dL   Albumin 4.1 3.5 - 5.2 g/dL  CBC with Differential/Platelet  Result Value Ref Range   WBC 5.2 4.0 - 10.5 K/uL   RBC 4.48 3.87 - 5.11 Mil/uL   Hemoglobin 13.4 12.0 - 15.0 g/dL   HCT 82.5 05.3 - 97.6 %   MCV 91.2 78.0 - 100.0 fl   MCHC 32.9 30.0 - 36.0 g/dL   RDW 73.4 19.3 - 79.0 %   Platelets  237.0 150.0 - 400.0 K/uL   Neutrophils Relative % 65.0 43.0 - 77.0 %   Lymphocytes Relative 24.1 12.0 - 46.0 %   Monocytes Relative 7.8 3.0 - 12.0 %   Eosinophils Relative 2.2 0.0 - 5.0 %   Basophils Relative 0.9 0.0 - 3.0 %   Neutro Abs 3.4 1.4 - 7.7 K/uL   Lymphs Abs 1.3 0.7 - 4.0 K/uL   Monocytes Absolute 0.4 0.1 - 1.0 K/uL   Eosinophils Absolute 0.1 0.0 - 0.7 K/uL   Basophils Absolute 0.0 0.0 - 0.1 K/uL  Lipid panel  Result Value Ref Range   Cholesterol 144 0 - 200 mg/dL   Triglycerides 46.0 0.0 - 149.0 mg/dL   HDL 68.40 >39.00 mg/dL   VLDL 9.2 0.0 - 40.0 mg/dL   LDL Cholesterol 67 0 - 99 mg/dL   Total CHOL/HDL Ratio 2    NonHDL 75.96    MM 3D SCREEN BREAST BILATERAL  Result Date: 04/15/2019 CLINICAL DATA:  Screening. EXAM: DIGITAL SCREENING BILATERAL MAMMOGRAM WITH TOMO AND CAD COMPARISON:  Previous exam(s). ACR Breast Density Category b: There are scattered areas of fibroglandular density. FINDINGS: There are no findings suspicious for malignancy. Images were processed with CAD. IMPRESSION: No mammographic evidence of malignancy. A result letter of this screening mammogram will be mailed directly to the patient. RECOMMENDATION: Screening mammogram in one year. (Code:SM-B-01Y) BI-RADS CATEGORY  1: Negative.  Electronically Signed   By: Lillia Mountain M.D.   On: 04/15/2019 14:41    Assessment and Plan:     ICD-10-CM   1. Healthcare maintenance  Z00.00    Doing great - keep everything up and working on exercise. Minimal other suggestions.  Health Maintenance Exam: The patient's preventative maintenance and recommended screening tests for an annual wellness exam were reviewed in full today. Brought up to date unless services declined.  Counselled on the importance of diet, exercise, and its role in overall health and mortality. The patient's FH and SH was reviewed, including their home life, tobacco status, and drug and alcohol status.  Follow-up in 1 year for physical exam or additional follow-up below.  Follow-up: No follow-ups on file. Or follow-up in 1 year if not noted.  Future Appointments  Date Time Provider [place]  04/23/2019  9:00 AM Sirinity Outland, Frederico Hamman, MD [facility]    No orders of the defined types were placed in this encounter.  There are no discontinued medications. No orders of the defined types were placed in this encounter.   Signed,  Maud Deed. Catrell Morrone, MD   Allergies as of 04/23/2019      Reactions   Codeine Nausea Only      Medication List       Accurate as of April 23, 2019  8:47 AM. If you have any questions, ask your nurse or doctor.        albuterol 108 (90 Base) MCG/ACT inhaler Commonly known as: VENTOLIN HFA Inhale 2 puffs into the lungs every 6 (six) hours as needed for wheezing.   amLODipine 5 MG tablet Commonly known as: NORVASC Take 1 tablet (5 mg total) by mouth daily.   atorvastatin 10 MG tablet Commonly known as: LIPITOR TAKE 1 TABLET BY MOUTH ONCE DAILY   CALCIUM 600 + D PO Take by mouth daily.   dorzolamide 2 % ophthalmic solution Commonly known as: TRUSOPT Place 1 drop into the right eye 2 (two) times daily.   GLUCOSAMINE CHONDR COMPLEX PO Take 1 tablet by mouth daily.  hydrochlorothiazide 12.5 MG  tablet Commonly known as: HYDRODIURIL Take 12.5 mg by mouth daily.   hydrochlorothiazide 12.5 MG capsule Commonly known as: MICROZIDE TAKE 1 TABLET BY MOUTH ONCE A DAY   ICaps Areds 2 Caps Take 1 each by mouth 2 (two) times daily.   latanoprost 0.005 % ophthalmic solution Commonly known as: XALATAN   losartan 50 MG tablet Commonly known as: COZAAR TAKE 1 TABLET BY MOUTH ONCE A DAY   Spiriva HandiHaler 18 MCG inhalation capsule Generic drug: tiotropium INHALE CONTENTS OF ONE CAPSULES ONCE DAILY   Tussionex Pennkinetic ER 10-8 MG/5ML Suer Generic drug: chlorpheniramine-HYDROcodone Take 5 mLs by mouth.   Vitamin D 50 MCG (2000 UT) Caps Take by mouth 2 (two) times daily.

## 2019-05-05 ENCOUNTER — Other Ambulatory Visit: Payer: Self-pay | Admitting: *Deleted

## 2019-05-05 MED ORDER — SPIRIVA HANDIHALER 18 MCG IN CAPS: ORAL_CAPSULE | RESPIRATORY_TRACT | 3 refills | Status: DC

## 2019-06-02 ENCOUNTER — Other Ambulatory Visit: Payer: Self-pay | Admitting: Family Medicine

## 2019-06-02 DIAGNOSIS — H353131 Nonexudative age-related macular degeneration, bilateral, early dry stage: Secondary | ICD-10-CM | POA: Diagnosis not present

## 2019-06-02 MED ORDER — HYDROCHLOROTHIAZIDE 12.5 MG PO CAPS: ORAL_CAPSULE | ORAL | 3 refills | Status: DC

## 2019-06-02 NOTE — Addendum Note (Signed)
Addended by: Damita Lack on: 06/02/2019 02:42 PM   Modules accepted: Orders

## 2019-06-10 ENCOUNTER — Other Ambulatory Visit: Payer: Self-pay | Admitting: *Deleted

## 2019-06-10 MED ORDER — LOSARTAN POTASSIUM 50 MG PO TABS: 50.0000 mg | ORAL_TABLET | Freq: Every day | ORAL | 3 refills | Status: DC

## 2019-12-01 DIAGNOSIS — H40053 Ocular hypertension, bilateral: Secondary | ICD-10-CM | POA: Diagnosis not present

## 2019-12-01 DIAGNOSIS — H353131 Nonexudative age-related macular degeneration, bilateral, early dry stage: Secondary | ICD-10-CM | POA: Diagnosis not present

## 2020-01-09 DIAGNOSIS — H04123 Dry eye syndrome of bilateral lacrimal glands: Secondary | ICD-10-CM | POA: Diagnosis not present

## 2020-01-22 IMAGING — MG DIGITAL SCREENING BILATERAL MAMMOGRAM WITH TOMO AND CAD
8 series · 8 of 24 positions shown · non-contrast
Comparison: Previous exam(s).

ACR Breast Density Category a: The breast tissue is almost entirely
fatty.

CLINICAL DATA: Screening.

EXAM:
DIGITAL SCREENING BILATERAL MAMMOGRAM WITH TOMO AND CAD

[R MLO synth-2D]
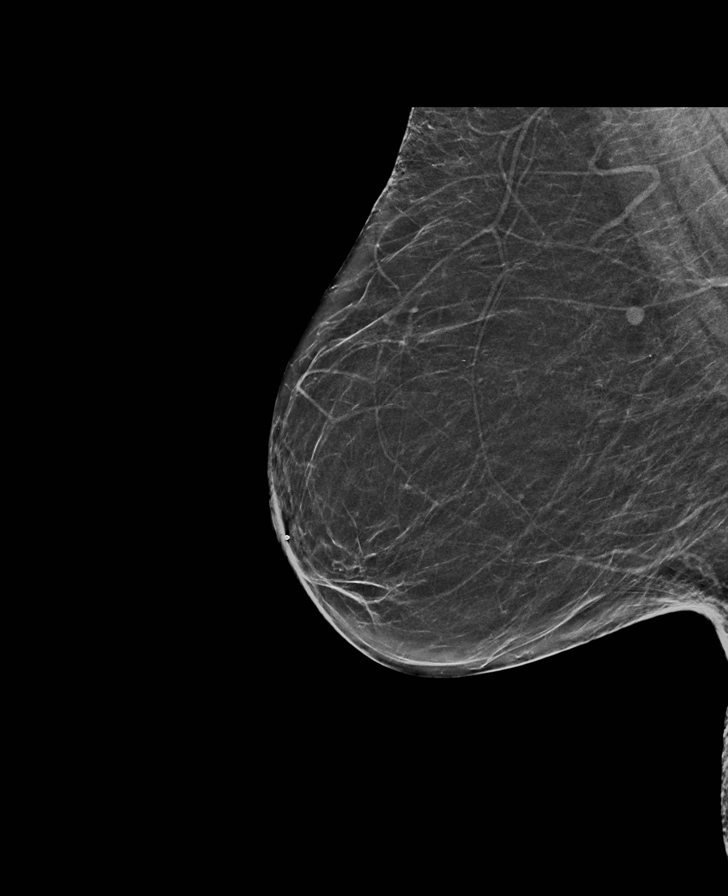

[R CC synth-2D]
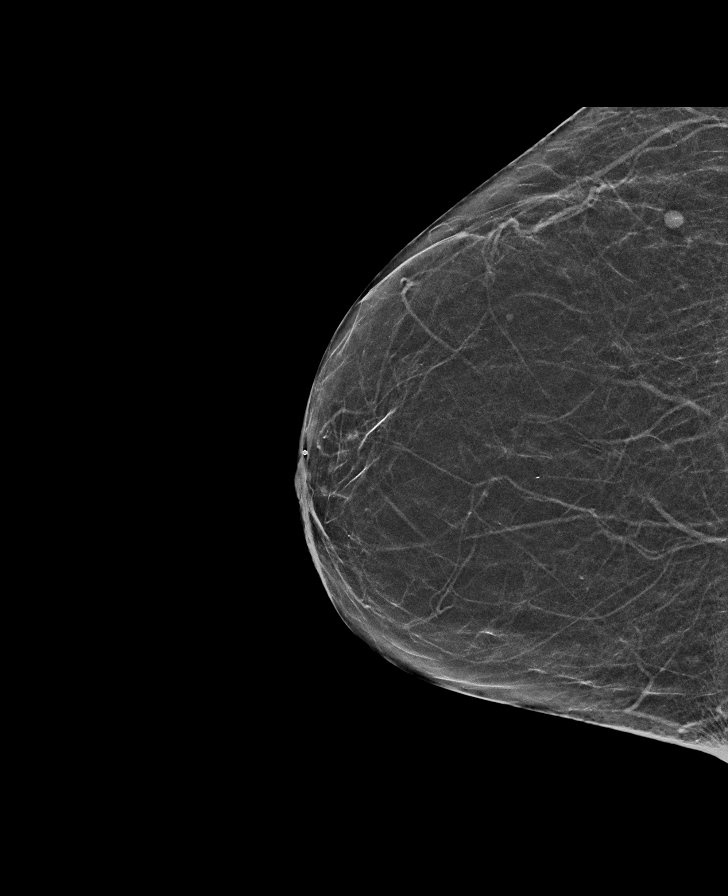

[L MLO synth-2D]
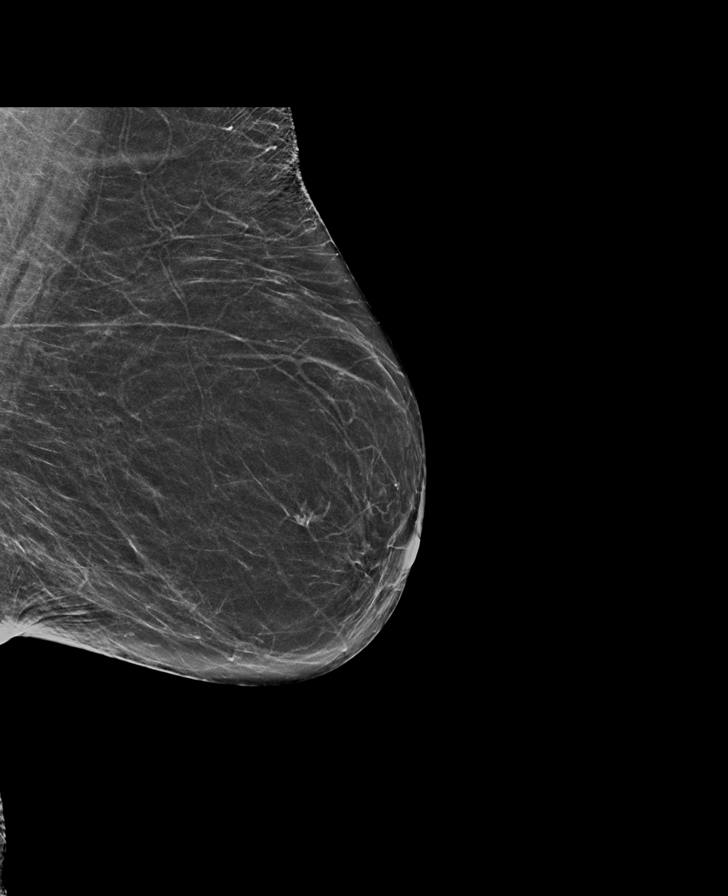

[L CC synth-2D]
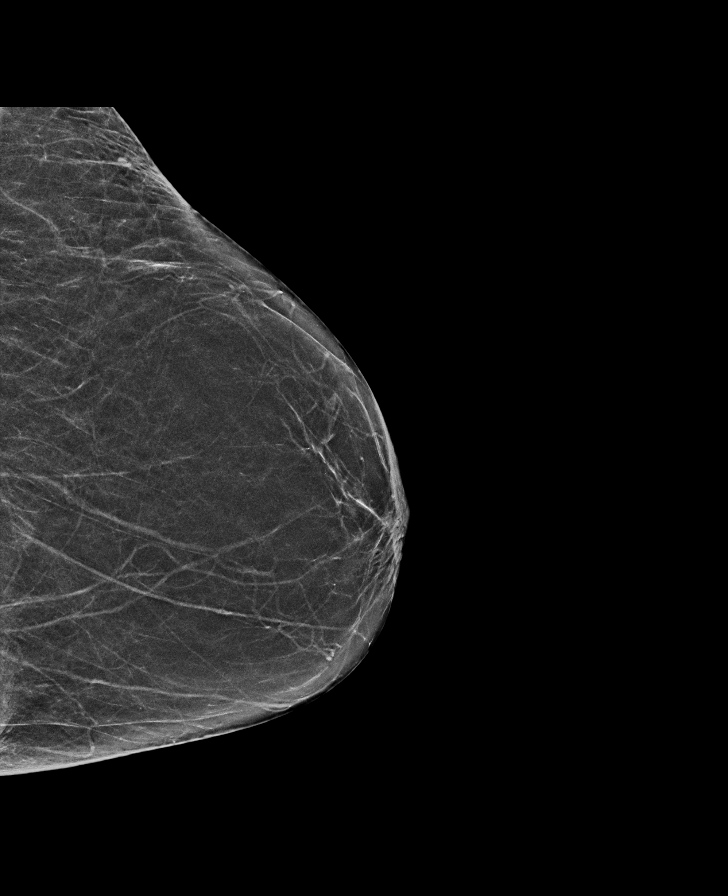

[R CC tomo · tomo slice 26/51.0]
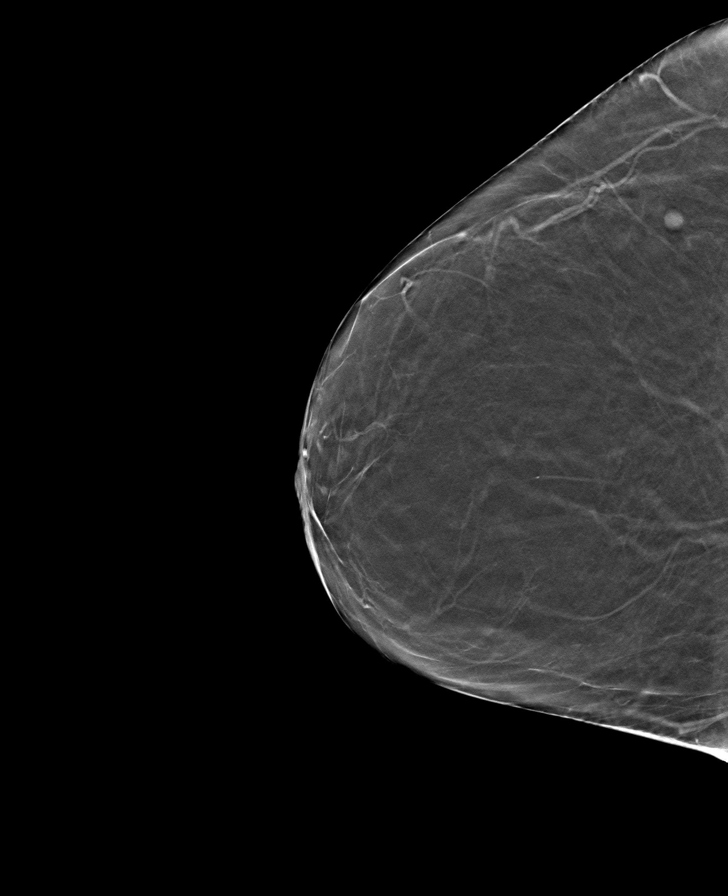

[L MLO tomo · tomo slice 29/57.0]
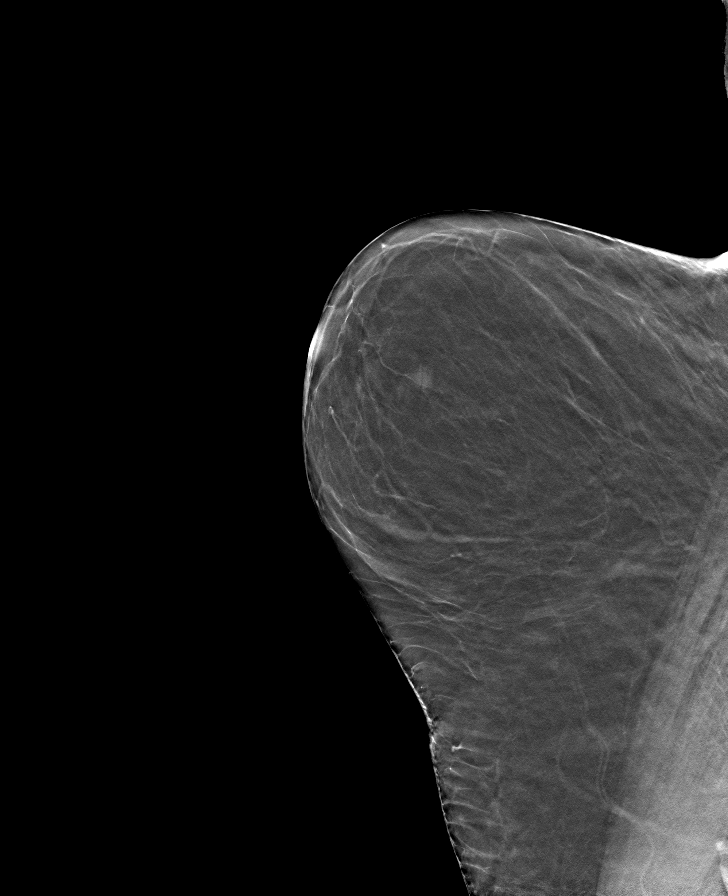

[R MLO tomo · tomo slice 29/57.0]
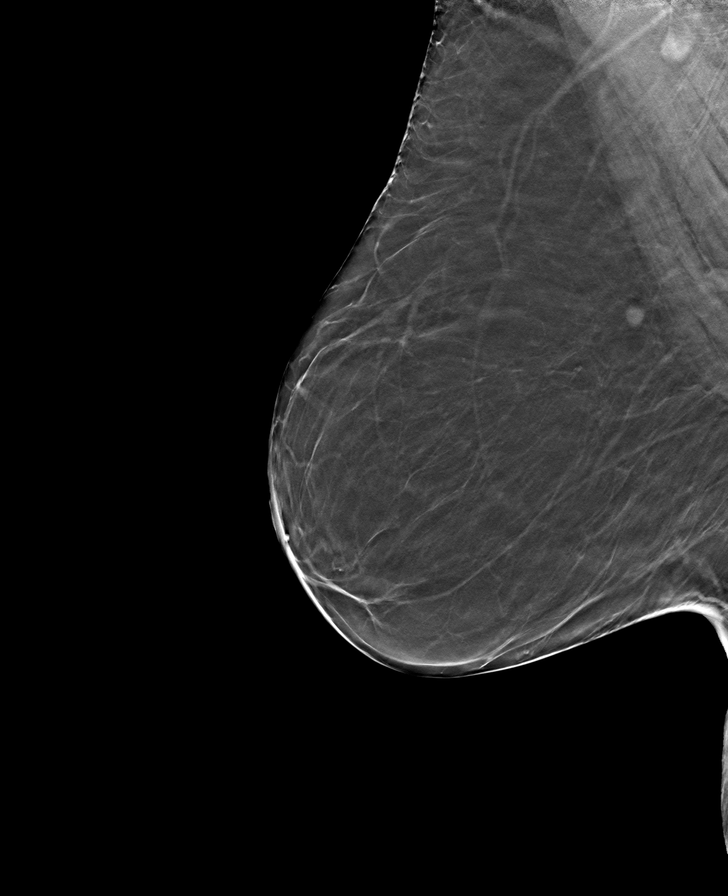

[L CC tomo · tomo slice 27/54.0]
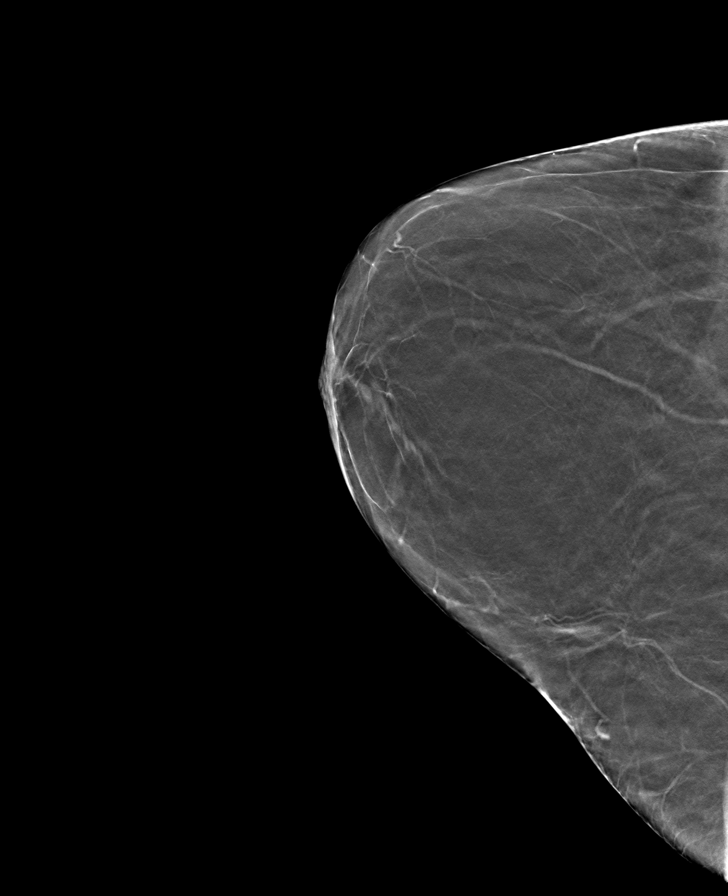

[8 of 24 positions shown; findings below may reference images not displayed]

FINDINGS: There are no findings suspicious for malignancy. Images were
processed with CAD.
IMPRESSION: No mammographic evidence of malignancy. A result letter of this
screening mammogram will be mailed directly to the patient.

RECOMMENDATION:
Screening mammogram in one year. (Code:[id number]-[id number])

BI-RADS CATEGORY  1: Negative.

## 2020-03-16 ENCOUNTER — Other Ambulatory Visit: Payer: Self-pay | Admitting: Family Medicine

## 2020-03-16 DIAGNOSIS — Z1231 Encounter for screening mammogram for malignant neoplasm of breast: Secondary | ICD-10-CM

## 2020-04-15 ENCOUNTER — Ambulatory Visit
Admission: RE | Admit: 2020-04-15 | Discharge: 2020-04-15 | Disposition: A | Discharge status: Home / Self Care | Payer: Medicare PPO | Source: Ambulatory Visit | Attending: Family Medicine | Admitting: Family Medicine

## 2020-04-15 ENCOUNTER — Other Ambulatory Visit: Payer: Self-pay

## 2020-04-15 DIAGNOSIS — Z1231 Encounter for screening mammogram for malignant neoplasm of breast: Secondary | ICD-10-CM | POA: Insufficient documentation

## 2020-05-18 ENCOUNTER — Other Ambulatory Visit: Payer: Self-pay | Admitting: Family Medicine

## 2020-05-18 NOTE — Telephone Encounter (Signed)
Pleases schedule MWV with nurse and CPE with Dr. Patsy Lager.

## 2020-05-19 NOTE — Telephone Encounter (Signed)
Left voice message to call the office  

## 2020-05-25 ENCOUNTER — Other Ambulatory Visit: Payer: Self-pay | Admitting: Family Medicine

## 2020-06-02 DIAGNOSIS — Z0100 Encounter for examination of eyes and vision without abnormal findings: Secondary | ICD-10-CM | POA: Diagnosis not present

## 2020-06-02 DIAGNOSIS — H353131 Nonexudative age-related macular degeneration, bilateral, early dry stage: Secondary | ICD-10-CM | POA: Diagnosis not present

## 2020-06-04 ENCOUNTER — Other Ambulatory Visit: Payer: Self-pay | Admitting: Family Medicine

## 2020-07-05 ENCOUNTER — Other Ambulatory Visit: Payer: Self-pay

## 2020-07-05 ENCOUNTER — Ambulatory Visit (INDEPENDENT_AMBULATORY_CARE_PROVIDER_SITE_OTHER): Payer: Medicare PPO

## 2020-07-05 DIAGNOSIS — Z0000 Encounter for general adult medical examination without abnormal findings: Secondary | ICD-10-CM | POA: Diagnosis not present

## 2020-07-05 NOTE — Progress Notes (Signed)
Subjective:   Rebecca Hamilton is a 85 y.o. female who presents for Medicare Annual (Subsequent) preventive examination.  Review of Systems: N/A      I connected with the patient today by telephone and verified that I am speaking with the correct person using two identifiers. Location patient: home Location nurse: work Persons participating in the telephone visit: patient, nurse.   I discussed the limitations, risks, security and privacy concerns of performing an evaluation and management service by telephone and the availability of in person appointments. I also discussed with the patient that there may be a patient responsible charge related to this service. The patient expressed understanding and verbally consented to this telephonic visit.        Cardiac Risk Factors include: advanced age (>50men, >64 women);hypertension;Other (see comment), Risk factor comments: hyperlipidemia     Objective:    Today's Vitals   There is no height or weight on file to calculate BMI.  Advanced Directives 07/05/2020 04/18/2019 11/05/2018 10/08/2018  Does Patient Have a Medical Advance Directive? Yes Yes Yes -  Type of Estate agent of [place];Living will Healthcare Power of [place];Living will Healthcare Power of [place];Living will Healthcare Power of [place];Living will  Does patient want to make changes to medical advance directive? - - No - Patient declined No - Patient declined  Copy of Healthcare Power of Attorney in Chart? No - copy requested Yes - validated most recent copy scanned in chart (See row information) No - copy requested Yes - validated most recent copy scanned in chart (See row information)    Current Medications (verified) Outpatient Encounter Medications as of 07/05/2020  Medication Sig  . albuterol (PROVENTIL HFA;VENTOLIN HFA) 108 (90 Base) MCG/ACT inhaler Inhale 2 puffs into the lungs every 6 (six) hours as needed for wheezing.  Marland Kitchen amLODipine  (NORVASC) 5 MG tablet TAKE 1 TABLET BY MOUTH ONCE DAILY  . atorvastatin (LIPITOR) 10 MG tablet TAKE 1 TABLET BY MOUTH ONCE DAILY  . Calcium-Vitamin D-Vitamin K (VIACTIV CALCIUM PLUS D) 650-12.5-40 MG-MCG-MCG CHEW Chew 2 each by mouth daily.  . chlorpheniramine-HYDROcodone (TUSSIONEX) 10-8 MG/5ML SUER Take 5 mLs by mouth.  . Cholecalciferol (VITAMIN D) 2000 UNITS CAPS Take by mouth 2 (two) times daily.  . dorzolamide (TRUSOPT) 2 % ophthalmic solution Place 1 drop into the right eye 2 (two) times daily.  . Glucosamine-Chondroitin (GLUCOSAMINE CHONDR COMPLEX PO) Take 1 tablet by mouth daily.  . hydrochlorothiazide (MICROZIDE) 12.5 MG capsule TAKE 1 CAPSULE BY MOUTH ONCE DAILY  . latanoprost (XALATAN) 0.005 % ophthalmic solution   . losartan (COZAAR) 50 MG tablet TAKE 1 TABLET BY MOUTH ONCE DAILY  . Multiple Vitamins-Minerals (ICAPS AREDS 2) CAPS Take 1 each by mouth 2 (two) times daily.  Marland Kitchen SPIRIVA HANDIHALER 18 MCG inhalation capsule INHALE CONTENTS OF ONE CAPSULES ONCE DAILY   No facility-administered encounter medications on file as of 07/05/2020.    Allergies (verified) Codeine   History: Past Medical History:  Diagnosis Date  . Colon polyps   . COPD (chronic obstructive pulmonary disease) (HCC)   . GERD (gastroesophageal reflux disease)   . HTN (hypertension)   . Hyperlipidemia   . Osteopenia    Past Surgical History:  Procedure Laterality Date  . CATARACT EXTRACTION W/PHACO Left 10/08/2018   Procedure: CATARACT EXTRACTION PHACO AND INTRAOCULAR LENS PLACEMENT (IOC) LEFT;  Surgeon: Galen Manila, MD;  [facility];  Service: Ophthalmology;  Laterality: Left;  . CATARACT EXTRACTION W/PHACO Right 11/05/2018  Procedure: CATARACT EXTRACTION PHACO AND INTRAOCULAR LENS PLACEMENT (IOC) - right  00:34  15.3%  5.32;  Surgeon: Galen Manila, MD;  Location: [facility];  Service: Ophthalmology;  Laterality: Right;  . COLONOSCOPY    . DENTAL SURGERY    . EYE  SURGERY     Family History  Problem Relation Age of Onset  . Diabetes Father   . Heart disease Father   . Diabetes Brother   . Heart disease Mother   . Breast cancer Neg Hx    Social History   Socioeconomic History  . Marital status: Married    Spouse name: Not on file  . Number of children: 2  . Years of education: Not on file  . Highest education level: Not on file  Occupational History  . Occupation: retired    Associate Professor: retired  Tobacco Use  . Smoking status: Former Smoker    Quit date: 1988    Years since quitting: 34.3  . Smokeless tobacco: Never Used  . Tobacco comment: quit 1988  Vaping Use  . Vaping Use: Never used  Substance and Sexual Activity  . Alcohol use: Yes    Alcohol/week: 2.0 standard drinks    Types: 1 Glasses of wine, 1 Shots of liquor per week    Comment: daily  . Drug use: No  . Sexual activity: Not on file  Other Topics Concern  . Not on file  Social History Narrative   Regular exercise--no   Social Determinants of Health   Financial Resource Strain: Low Risk   . Difficulty of Paying Living Expenses: Not hard at all  Food Insecurity: No Food Insecurity  . Worried About Programme researcher, broadcasting/film/video in the Last Year: Never true  . Ran Out of Food in the Last Year: Never true  Transportation Needs: No Transportation Needs  . Lack of Transportation (Medical): No  . Lack of Transportation (Non-Medical): No  Physical Activity: Inactive  . Days of Exercise per Week: 0 days  . Minutes of Exercise per Session: 0 min  Stress: No Stress Concern Present  . Feeling of Stress : Not at all  Social Connections: Not on file    Tobacco Counseling Counseling given: Not Answered Comment: quit 1988   Clinical Intake:  Pre-visit preparation completed: Yes  Pain : No/denies pain     Nutritional Risks: None Diabetes: No  How often do you need to have someone help you when you read instructions, pamphlets, or other written materials from your doctor or  pharmacy?: 1 - Never  Diabetic: No Nutrition Risk Assessment:  Has the patient had any N/V/D within the last 2 months?  No  Does the patient have any non-healing wounds?  No  Has the patient had any unintentional weight loss or weight gain?  No   Diabetes:  Is the patient diabetic?  No  If diabetic, was a CBG obtained today?  N/A Did the patient bring in their glucometer from home?  N/A How often do you monitor your CBG's? N/A.   Financial Strains and Diabetes Management:  Are you having any financial strains with the device, your supplies or your medication? N/A.  Does the patient want to be seen by Chronic Care Management for management of their diabetes?  N/A Would the patient like to be referred to a Nutritionist or for Diabetic Management?  N/A  Interpreter Needed?: No  Information entered by :: CJohnson, LPN   Activities of Daily Living In your present state of  health, do you have any difficulty performing the following activities: 07/05/2020  Hearing? Y  Comment wears hearing aids  Vision? N  Difficulty concentrating or making decisions? N  Walking or climbing stairs? N  Dressing or bathing? N  Doing errands, shopping? N  Preparing Food and eating ? N  Using the Toilet? N  In the past six months, have you accidently leaked urine? N  Do you have problems with loss of bowel control? N  Managing your Medications? N  Managing your Finances? N  Housekeeping or managing your Housekeeping? N  Some recent data might be hidden    Patient Care Team: Hannah Beatopland, Spencer, MD as PCP - General Copland, Karleen HampshireSpencer, MD  Indicate any recent Medical Services you may have received from other than [facility] providers in the past year (date may be approximate).     Assessment:   This is a routine wellness examination for Rebecca DavenportSandra.  Hearing/Vision screen  Hearing Screening   125Hz  250Hz  500Hz  1000Hz  2000Hz  3000Hz  4000Hz  6000Hz  8000Hz   Right ear:           Left ear:           Vision  Screening Comments: Patient gets annual eye exams   Dietary issues and exercise activities discussed: Current Exercise Habits: The patient does not participate in regular exercise at present, Exercise limited by: None identified  Goals    . Patient Stated     04/18/2019, I will maintain and stay healthy. Very anxious to get back on the golf course.    . Patient Stated     07/05/2020, I will maintain and continue medications as prescribed.       Depression Screen PHQ 2/9 Scores 07/05/2020 04/18/2019 04/10/2018 04/09/2017 03/07/2016 01/18/2015 12/03/2013  PHQ - 2 Score 0 1 0 0 0 0 0  PHQ- 9 Score 0 1 - - - - -    Fall Risk Fall Risk  07/05/2020 04/18/2019 04/10/2018 04/09/2017 03/07/2016  Falls in the past year? 1 1 1  No No  Comment - tripped on crack in driveway - - -  Number falls in past yr: 0 0 1 - -  Injury with Fall? 0 0 0 - -  Risk for fall due to : Medication side effect History of fall(s);Medication side effect - - -  Follow up Falls evaluation completed;Falls prevention discussed Falls evaluation completed;Falls prevention discussed - - -    FALL RISK PREVENTION PERTAINING TO THE HOME:  Any stairs in or around the home? Yes  If so, are there any without handrails? No  Home free of loose throw rugs in walkways, pet beds, electrical cords, etc? Yes  Adequate lighting in your home to reduce risk of falls? Yes   ASSISTIVE DEVICES UTILIZED TO PREVENT FALLS:  Life alert? No  Use of a cane, walker or w/c? No  Grab bars in the bathroom? No  Shower chair or bench in shower? No  Elevated toilet seat or a handicapped toilet? No   TIMED UP AND GO:  Was the test performed? N/A telephone visit .   Cognitive Function: MMSE - Mini Mental State Exam 07/05/2020 04/18/2019  Orientation to time 5 5  Orientation to Place 5 5  Registration 3 3  Attention/ Calculation 5 5  Recall 3 3  Language- repeat 1 1  Mini Cog  Mini-Cog screen was completed. Maximum score is 22. A value of 0 denotes  this part of the MMSE was not completed or the patient failed this  part of the Mini-Cog screening.       Immunizations Immunization History  Administered Date(s) Administered  . Fluad Quad(high Dose 65+) 01/07/2019  . Hepatitis A 06/26/1995, 12/26/1995  . Hepatitis B 12/01/2005, 01/01/2006, 07/06/2006  . Influenza Split 12/29/2011  . Influenza Whole 03/14/2007  . Influenza,inj,Quad PF,6+ Mos 12/03/2013, 01/11/2015, 01/18/2016, 01/04/2017, 12/24/2017  . Influenza-Unspecified 12/11/2012  . PFIZER(Purple Top)SARS-COV-2 Vaccination 03/19/2019, 04/08/2019, 12/12/2019  . Pneumococcal Conjugate-13 12/03/2013  . Pneumococcal Polysaccharide-23 03/13/2005, 12/01/2005  . Td 03/13/2005, 12/01/2005  . Zoster 07/06/2006  . Zoster Recombinat (Shingrix) 12/27/2017, 03/25/2018    TDAP status: Due, Education has been provided regarding the importance of this vaccine. Advised may receive this vaccine at local pharmacy or Health Dept. Aware to provide a copy of the vaccination record if obtained from local pharmacy or Health Dept. Verbalized acceptance and understanding.  Flu Vaccine status: due Fall 2022  Pneumococcal vaccine status: Up to date  Covid-19 vaccine status: Completed vaccines  Qualifies for Shingles Vaccine? Yes   Zostavax completed Yes   Shingrix Completed?: Yes  Screening Tests Health Maintenance  Topic Date Due  . TETANUS/TDAP  12/02/2015  . INFLUENZA VACCINE  10/11/2020  . MAMMOGRAM  04/15/2021  . DEXA SCAN  Completed  . COVID-19 Vaccine  Completed  . PNA vac Low Risk Adult  Completed  . HPV VACCINES  Aged Out    Health Maintenance  Health Maintenance Due  Topic Date Due  . TETANUS/TDAP  12/02/2015    Colorectal cancer screening: No longer required.   Mammogram status: Completed 04/15/2020. Repeat every year  Bone Density status: due, will discuss with provider   Lung Cancer Screening: (Low Dose CT Chest recommended if Age 12-80 years, 30 pack-year currently  smoking OR have quit w/in 15 years.) does not qualify.   Additional Screening:  Hepatitis C Screening: does not qualify; Completed N/A  Vision Screening: Recommended annual ophthalmology exams for early detection of glaucoma and other disorders of the eye. Is the patient up to date with their annual eye exam?  Yes  Who is the provider or what is the name of the office in which the patient attends annual eye exams? Dr. Alinda Money, [facility] If pt is not established with a provider, would they like to be referred to a provider to establish care? No .   Dental Screening: Recommended annual dental exams for proper oral hygiene  Community Resource Referral / Chronic Care Management: CRR required this visit?  No   CCM required this visit?  No      Plan:     I have personally reviewed and noted the following in the patient's chart:   . Medical and social history . Use of alcohol, tobacco or illicit drugs  . Current medications and supplements . Functional ability and status . Nutritional status . Physical activity . Advanced directives . List of other physicians . Hospitalizations, surgeries, and ER visits in previous 12 months . Vitals . Screenings to include cognitive, depression, and falls . Referrals and appointments  In addition, I have reviewed and discussed with patient certain preventive protocols, quality metrics, and best practice recommendations. A written personalized care plan for preventive services as well as general preventive health recommendations were provided to patient.   Due to this being a telephonic visit, the after visit summary with patients personalized plan was offered to patient via office or my-chart. Patient preferred to pick up at office at next visit or via mychart.   Janalyn Shy, LPN  07/05/2020        

## 2020-07-05 NOTE — Patient Instructions (Signed)
Rebecca Hamilton , Thank you for taking time to come for your Medicare Wellness Visit. I appreciate your ongoing commitment to your health goals. Please review the following plan we discussed and let me know if I can assist you in the future.   Screening recommendations/referrals: Colonoscopy: no longer required  Mammogram: Up to date, completed 04/15/2020, due 04/2021 Bone Density: due, will discuss with provider  Recommended yearly ophthalmology/optometry visit for glaucoma screening and checkup Recommended yearly dental visit for hygiene and checkup  Vaccinations: Influenza vaccine: due Fall 2022 Pneumococcal vaccine: Completed series Tdap vaccine: decline-insurance  Shingles vaccine: Completed series   Covid-19:Completed series  Advanced directives: copy in chart   Conditions/risks identified: hypertension, hyperlipidemia   Next appointment: Follow up in one year for your annual wellness visit    Preventive Care 65 Years and Older, Female Preventive care refers to lifestyle choices and visits with your health care provider that can promote health and wellness. What does preventive care include?  A yearly physical exam. This is also called an annual well check.  Dental exams once or twice a year.  Routine eye exams. Ask your health care provider how often you should have your eyes checked.  Personal lifestyle choices, including:  Daily care of your teeth and gums.  Regular physical activity.  Eating a healthy diet.  Avoiding tobacco and drug use.  Limiting alcohol use.  Practicing safe sex.  Taking low-dose aspirin every day.  Taking vitamin and mineral supplements as recommended by your health care provider. What happens during an annual well check? The services and screenings done by your health care provider during your annual well check will depend on your age, overall health, lifestyle risk factors, and family history of disease. Counseling  Your health care  provider may ask you questions about your:  Alcohol use.  Tobacco use.  Drug use.  Emotional well-being.  Home and relationship well-being.  Sexual activity.  Eating habits.  History of falls.  Memory and ability to understand (cognition).  Work and work Astronomer.  Reproductive health. Screening  You may have the following tests or measurements:  Height, weight, and BMI.  Blood pressure.  Lipid and cholesterol levels. These may be checked every 5 years, or more frequently if you are over 75 years old.  Skin check.  Lung cancer screening. You may have this screening every year starting at age 82 if you have a 30-pack-year history of smoking and currently smoke or have quit within the past 15 years.  Fecal occult blood test (FOBT) of the stool. You may have this test every year starting at age 26.  Flexible sigmoidoscopy or colonoscopy. You may have a sigmoidoscopy every 5 years or a colonoscopy every 10 years starting at age 33.  Hepatitis C blood test.  Hepatitis B blood test.  Sexually transmitted disease (STD) testing.  Diabetes screening. This is done by checking your blood sugar (glucose) after you have not eaten for a while (fasting). You may have this done every 1-3 years.  Bone density scan. This is done to screen for osteoporosis. You may have this done starting at age 77.  Mammogram. This may be done every 1-2 years. Talk to your health care provider about how often you should have regular mammograms. Talk with your health care provider about your test results, treatment options, and if necessary, the need for more tests. Vaccines  Your health care provider may recommend certain vaccines, such as:  Influenza vaccine. This is recommended every  year.  Tetanus, diphtheria, and acellular pertussis (Tdap, Td) vaccine. You may need a Td booster every 10 years.  Zoster vaccine. You may need this after age 59.  Pneumococcal 13-valent conjugate (PCV13)  vaccine. One dose is recommended after age 74.  Pneumococcal polysaccharide (PPSV23) vaccine. One dose is recommended after age 56. Talk to your health care provider about which screenings and vaccines you need and how often you need them. This information is not intended to replace advice given to you by your health care provider. Make sure you discuss any questions you have with your health care provider. Document Released: 03/26/2015 Document Revised: 11/17/2015 Document Reviewed: 12/29/2014 Elsevier Interactive Patient Education  2017 [employer] Prevention in the Home Falls can cause injuries. They can happen to people of all ages. There are many things you can do to make your home safe and to help prevent falls. What can I do on the outside of my home?  Regularly fix the edges of walkways and driveways and fix any cracks.  Remove anything that might make you trip as you walk through a door, such as a raised step or threshold.  Trim any bushes or trees on the path to your home.  Use bright outdoor lighting.  Clear any walking paths of anything that might make someone trip, such as rocks or tools.  Regularly check to see if handrails are loose or broken. Make sure that both sides of any steps have handrails.  Any raised decks and porches should have guardrails on the edges.  Have any leaves, snow, or ice cleared regularly.  Use sand or salt on walking paths during winter.  Clean up any spills in your garage right away. This includes oil or grease spills. What can I do in the bathroom?  Use night lights.  Install grab bars by the toilet and in the tub and shower. Do not use towel bars as grab bars.  Use non-skid mats or decals in the tub or shower.  If you need to sit down in the shower, use a plastic, non-slip stool.  Keep the floor dry. Clean up any water that spills on the floor as soon as it happens.  Remove soap buildup in the tub or shower  regularly.  Attach bath mats securely with double-sided non-slip rug tape.  Do not have throw rugs and other things on the floor that can make you trip. What can I do in the bedroom?  Use night lights.  Make sure that you have a light by your bed that is easy to reach.  Do not use any sheets or blankets that are too big for your bed. They should not hang down onto the floor.  Have a firm chair that has side arms. You can use this for support while you get dressed.  Do not have throw rugs and other things on the floor that can make you trip. What can I do in the kitchen?  Clean up any spills right away.  Avoid walking on wet floors.  Keep items that you use a lot in easy-to-reach places.  If you need to reach something above you, use a strong step stool that has a grab bar.  Keep electrical cords out of the way.  Do not use floor polish or wax that makes floors slippery. If you must use wax, use non-skid floor wax.  Do not have throw rugs and other things on the floor that can make you trip. What can  I do with my stairs?  Do not leave any items on the stairs.  Make sure that there are handrails on both sides of the stairs and use them. Fix handrails that are broken or loose. Make sure that handrails are as long as the stairways.  Check any carpeting to make sure that it is firmly attached to the stairs. Fix any carpet that is loose or worn.  Avoid having throw rugs at the top or bottom of the stairs. If you do have throw rugs, attach them to the floor with carpet tape.  Make sure that you have a light switch at the top of the stairs and the bottom of the stairs. If you do not have them, ask someone to add them for you. What else can I do to help prevent falls?  Wear shoes that:  Do not have high heels.  Have rubber bottoms.  Are comfortable and fit you well.  Are closed at the toe. Do not wear sandals.  If you use a stepladder:  Make sure that it is fully  opened. Do not climb a closed stepladder.  Make sure that both sides of the stepladder are locked into place.  Ask someone to hold it for you, if possible.  Clearly mark and make sure that you can see:  Any grab bars or handrails.  First and last steps.  Where the edge of each step is.  Use tools that help you move around (mobility aids) if they are needed. These include:  Canes.  Walkers.  Scooters.  Crutches.  Turn on the lights when you go into a dark area. Replace any light bulbs as soon as they burn out.  Set up your furniture so you have a clear path. Avoid moving your furniture around.  If any of your floors are uneven, fix them.  If there are any pets around you, be aware of where they are.  Review your medicines with your doctor. Some medicines can make you feel dizzy. This can increase your chance of falling. Ask your doctor what other things that you can do to help prevent falls. This information is not intended to replace advice given to you by your health care provider. Make sure you discuss any questions you have with your health care provider. Document Released: 12/24/2008 Document Revised: 08/05/2015 Document Reviewed: 04/03/2014 Elsevier Interactive Patient Education  2017 [employer].

## 2020-07-05 NOTE — Progress Notes (Signed)
PCP notes:  Health Maintenance: Tdap- insurance Dexa- due   Abnormal Screenings: none   Patient concerns: none   Nurse concerns: none   Next PCP appt.: 07/14/2020 @ 8:20 am

## 2020-07-09 ENCOUNTER — Other Ambulatory Visit (INDEPENDENT_AMBULATORY_CARE_PROVIDER_SITE_OTHER): Payer: Medicare PPO

## 2020-07-09 ENCOUNTER — Other Ambulatory Visit: Payer: Self-pay

## 2020-07-09 ENCOUNTER — Ambulatory Visit: Payer: Medicare PPO

## 2020-07-09 DIAGNOSIS — E785 Hyperlipidemia, unspecified: Secondary | ICD-10-CM | POA: Diagnosis not present

## 2020-07-09 DIAGNOSIS — R7303 Prediabetes: Secondary | ICD-10-CM | POA: Diagnosis not present

## 2020-07-09 DIAGNOSIS — E559 Vitamin D deficiency, unspecified: Secondary | ICD-10-CM | POA: Diagnosis not present

## 2020-07-09 DIAGNOSIS — Z79899 Other long term (current) drug therapy: Secondary | ICD-10-CM | POA: Diagnosis not present

## 2020-07-09 LAB — CBC WITH DIFFERENTIAL/PLATELET
Basophils Absolute: 0.1 10*3/uL (ref 0.0–0.1)
Basophils Relative: 1.1 % (ref 0.0–3.0)
Eosinophils Absolute: 0.2 10*3/uL (ref 0.0–0.7)
Eosinophils Relative: 3.4 % (ref 0.0–5.0)
HCT: 40.1 % (ref 36.0–46.0)
Hemoglobin: 13.5 g/dL (ref 12.0–15.0)
Lymphocytes Relative: 25.9 % (ref 12.0–46.0)
Lymphs Abs: 1.3 10*3/uL (ref 0.7–4.0)
MCHC: 33.6 g/dL (ref 30.0–36.0)
MCV: 90.9 fl (ref 78.0–100.0)
Monocytes Absolute: 0.4 10*3/uL (ref 0.1–1.0)
Monocytes Relative: 8.1 % (ref 3.0–12.0)
Neutro Abs: 3.1 10*3/uL (ref 1.4–7.7)
Neutrophils Relative %: 61.5 % (ref 43.0–77.0)
Platelets: 213 10*3/uL (ref 150.0–400.0)
RBC: 4.42 Mil/uL (ref 3.87–5.11)
RDW: 13.4 % (ref 11.5–15.5)
WBC: 5.1 10*3/uL (ref 4.0–10.5)

## 2020-07-09 LAB — HEPATIC FUNCTION PANEL
ALT: 25 U/L (ref 0–35)
AST: 19 U/L (ref 0–37)
Albumin: 4.1 g/dL (ref 3.5–5.2)
Alkaline Phosphatase: 61 U/L (ref 39–117)
Bilirubin, Direct: 0.1 mg/dL (ref 0.0–0.3)
Total Bilirubin: 0.6 mg/dL (ref 0.2–1.2)
Total Protein: 6.6 g/dL (ref 6.0–8.3)

## 2020-07-09 LAB — BASIC METABOLIC PANEL
BUN: 13 mg/dL (ref 6–23)
CO2: 29 mEq/L (ref 19–32)
Calcium: 9.6 mg/dL (ref 8.4–10.5)
Chloride: 104 mEq/L (ref 96–112)
Creatinine, Ser: 0.78 mg/dL (ref 0.40–1.20)
GFR: 70.48 mL/min (ref >60.00)
Glucose, Bld: 94 mg/dL (ref 70–99)
Potassium: 4 mEq/L (ref 3.5–5.1)
Sodium: 139 mEq/L (ref 135–145)

## 2020-07-09 LAB — LIPID PANEL
Cholesterol: 135 mg/dL (ref 0–200)
HDL: 58.6 mg/dL (ref >39.00)
LDL Cholesterol: 62 mg/dL (ref 0–99)
NonHDL: 76.17
Total CHOL/HDL Ratio: 2
Triglycerides: 69 mg/dL (ref 0.0–149.0)
VLDL: 13.8 mg/dL (ref 0.0–40.0)

## 2020-07-09 LAB — TSH: TSH: 3.13 u[IU]/mL (ref 0.35–4.50)

## 2020-07-09 LAB — VITAMIN D 25 HYDROXY (VIT D DEFICIENCY, FRACTURES): VITD: 64.81 ng/mL (ref 30.00–100.00)

## 2020-07-09 LAB — HEMOGLOBIN A1C: Hgb A1c MFr Bld: 6 % (ref 4.6–6.5)

## 2020-07-12 ENCOUNTER — Telehealth: Payer: Self-pay | Admitting: Family Medicine

## 2020-07-12 DIAGNOSIS — U071 COVID-19: Secondary | ICD-10-CM

## 2020-07-12 NOTE — Telephone Encounter (Signed)
Can you call  Stop cholesterol medication now.  1st oral dose has to be at least 12 hours off of cholesterol medication.  She would qualify for anti-covid medication, but we have to have a confirmed PCR test. Let us know when it is back.  Oral medication should work, but if there is a supply issue, then IV may be the only option.

## 2020-07-12 NOTE — Telephone Encounter (Signed)
Rebecca Hamilton called in due to his wife tested positive for covid doing the at-home test and now she is at [facility], and wanted to know what would be the next steps once it comes back positive. Wanted to know about medication or infusion.  Please advise

## 2020-07-13 ENCOUNTER — Other Ambulatory Visit: Payer: Self-pay | Admitting: Family Medicine

## 2020-07-13 DIAGNOSIS — U071 COVID-19: Secondary | ICD-10-CM

## 2020-07-13 MED ORDER — PAXLOVID 20 X 150 MG & 10 X 100MG PO TBPK: 3.0000 | ORAL_TABLET | Freq: Two times a day (BID) | ORAL | 0 refills | Status: DC

## 2020-07-13 NOTE — Telephone Encounter (Signed)
Chris from [facility] called and also stated that the [facility] [address] has this medication in stock. Please advise.

## 2020-07-13 NOTE — Telephone Encounter (Signed)
Mr. Rebecca Hamilton called in and stated that Dr. Patsy Lager sent her medication for Covid(Paxlovid) to [facility] but they do not have it and anted to know if he can send it too [facility] and the fax number [phone]

## 2020-07-13 NOTE — Telephone Encounter (Addendum)
Rebecca Hamilton notified as instructed by telephone.  He states [name] did test positive at [facility].  Results are in Care Everywhere.  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  Would like to move forward with treatment.

## 2020-07-13 NOTE — Telephone Encounter (Signed)
Rx resent to [facility] as requested by Rebecca Hamilton.  Rebecca Hamilton notified of this by telephone.

## 2020-07-13 NOTE — Telephone Encounter (Signed)
D/w patient and sent in Paxlovid

## 2020-07-14 ENCOUNTER — Encounter: Payer: Medicare PPO | Admitting: Family Medicine

## 2020-07-21 ENCOUNTER — Other Ambulatory Visit: Payer: Self-pay | Admitting: Family Medicine

## 2020-07-21 NOTE — Telephone Encounter (Signed)
Last office visit 04/23/2019 for CPE.  Medication listed as historical med.  Ok to refill?

## 2020-08-04 ENCOUNTER — Ambulatory Visit (INDEPENDENT_AMBULATORY_CARE_PROVIDER_SITE_OTHER): Payer: Medicare PPO | Admitting: Family Medicine

## 2020-08-04 ENCOUNTER — Encounter: Payer: Self-pay | Admitting: Family Medicine

## 2020-08-04 ENCOUNTER — Other Ambulatory Visit: Payer: Self-pay

## 2020-08-04 VITALS — BP 100/60 | HR 70 | Temp 97.5°F | Ht 61.5 in | Wt 145.8 lb

## 2020-08-04 DIAGNOSIS — Z0000 Encounter for general adult medical examination without abnormal findings: Secondary | ICD-10-CM

## 2020-08-04 NOTE — Progress Notes (Signed)
Rebecca Eagon T. Fishel Wamble, MD, CAQ Sports Medicine  Primary Care and Sports Medicine [facility] at [facility] [address] [place], [address]  Phone: [phone]  FAX: [phone]  [name] - 85 y.o. female  MRN [id number]  Date of Birth: [birth year]  Date: 08/04/2020  PCP: Hannah Beat, MD  Referral: Hannah Beat, MD  Chief Complaint  Patient presents with  . Annual Exam    Part 2    This visit occurred during the SARS-CoV-2 public health emergency.  Safety protocols were in place, including screening questions prior to the visit, additional usage of staff PPE, and extensive cleaning of exam room while observing appropriate contact time as indicated for disinfecting solutions.   Patient Care Team: Hannah Beat, MD as PCP - General Analeise Mccleery, Karleen Hampshire, MD Subjective:   Rebecca Hamilton is a 85 y.o. pleasant patient who presents with the following:  Health Maintenance Summary Reviewed and updated, unless pt declines services.  Tobacco History Reviewed. Non-smoker Alcohol: 1-2 a day generally Exercise Habits: Not as much recently with Covid STD concerns: none Drug Use: None Lumps or breast concerns: no  All vaccines are negative Does not need Paps anymore Just had Td  Health Maintenance  Topic Date Due  . INFLUENZA VACCINE  10/11/2020  . MAMMOGRAM  04/15/2021  . TETANUS/TDAP  08/04/2030  . DEXA SCAN  Completed  . COVID-19 Vaccine  Completed  . PNA vac Low Risk Adult  Completed  . HPV VACCINES  Aged Out    Immunization History  Administered Date(s) Administered  . Fluad Quad(high Dose 65+) 01/07/2019  . Hepatitis A 06/26/1995, 12/26/1995  . Hepatitis B 12/01/2005, 01/01/2006, 07/06/2006  . Influenza Split 12/29/2011  . Influenza Whole 03/14/2007  . Influenza,inj,Quad PF,6+ Mos 12/03/2013, 01/11/2015, 01/18/2016, 01/04/2017, 12/24/2017  . Influenza-Unspecified 12/11/2012  . PFIZER(Purple Top)SARS-COV-2 Vaccination  03/19/2019, 04/08/2019, 12/12/2019  . Pneumococcal Conjugate-13 12/03/2013  . Pneumococcal Polysaccharide-23 03/13/2005, 12/01/2005  . Td 03/13/2005, 12/01/2005  . Tdap 08/03/2020  . Zoster 07/06/2006  . Zoster Recombinat (Shingrix) 12/27/2017, 03/25/2018   Patient Active Problem List   Diagnosis Date Noted  . OSTEOPENIA 06/25/2008  . Hyperlipidemia 05/22/2008  . UNSPECIFIED ANEMIA 05/22/2008  . HYPERTENSION 05/22/2008  . COPD (chronic obstructive pulmonary disease) (HCC) 05/22/2008  . GERD 05/22/2008  . COLONIC POLYPS, HX OF 05/22/2008    Past Medical History:  Diagnosis Date  . Colon polyps   . COPD (chronic obstructive pulmonary disease) (HCC)   . GERD (gastroesophageal reflux disease)   . HTN (hypertension)   . Hyperlipidemia   . Osteopenia     Past Surgical History:  Procedure Laterality Date  . CATARACT EXTRACTION W/PHACO Left 10/08/2018   Procedure: CATARACT EXTRACTION PHACO AND INTRAOCULAR LENS PLACEMENT (IOC) LEFT;  Surgeon: Galen Manila, MD;  Location: [facility];  Service: Ophthalmology;  Laterality: Left;  . CATARACT EXTRACTION W/PHACO Right 11/05/2018   Procedure: CATARACT EXTRACTION PHACO AND INTRAOCULAR LENS PLACEMENT (IOC) - right  00:34  15.3%  5.32;  Surgeon: Galen Manila, MD;  Location: [facility];  Service: Ophthalmology;  Laterality: Right;  . COLONOSCOPY    . DENTAL SURGERY    . EYE SURGERY      Family History  Problem Relation Age of Onset  . Diabetes Father   . Heart disease Father   . Diabetes Brother   . Heart disease Mother   . Breast cancer Neg Hx     Past Medical History, Surgical History, Social History, Family  History, Problem List, Medications, and Allergies have been reviewed and updated if relevant.  Review of Systems: Pertinent positives are listed above.  Otherwise, a full 14 point review of systems has been done in full and it is negative except where it is noted positive.  Objective:   BP 100/60    Pulse 70   Temp (!) 97.5 F (36.4 C) (Temporal)   Ht 5' 1.5" (1.562 m)   Wt 145 lb 12 oz (66.1 kg)   SpO2 98%   BMI 27.09 kg/m  Ideal Body Weight: Weight in (lb) to have BMI = 25: 134.2 No exam data present Depression screen [facility] 2/9 07/05/2020 04/18/2019 04/10/2018 04/09/2017 03/07/2016  Decreased Interest 0 0 0 0 0  Down, Depressed, Hopeless 0 1 0 0 0  PHQ - 2 Score 0 1 0 0 0  Altered sleeping 0 0 - - -  Tired, decreased energy 0 0 - - -  Change in appetite 0 0 - - -  Feeling bad or failure about yourself  0 0 - - -  Trouble concentrating 0 0 - - -  Moving slowly or fidgety/restless 0 0 - - -  Suicidal thoughts 0 0 - - -  PHQ-9 Score 0 1 - - -  Difficult doing work/chores Not difficult at all Not difficult at all - - -     GEN: well developed, well nourished, no acute distress Eyes: conjunctiva and lids normal, PERRLA, EOMI ENT: TM clear, nares clear, oral exam WNL Neck: supple, no lymphadenopathy, no thyromegaly, no JVD Pulm: very mild B lower lung crackles, respiratory effort normal CV: regular rate and rhythm, S1-S2, no murmur, rub or gallop, no bruits Chest: no scars, masses, no lumps BREAST: breast exam declined GI: soft, non-tender; no hepatosplenomegaly, masses; active bowel sounds all quadrants GU: GU exam declined Lymph: no cervical, axillary or inguinal adenopathy MSK: gait normal, muscle tone and strength WNL, no joint swelling, effusions, discoloration, crepitus  SKIN: clear, good turgor, color WNL, no rashes, lesions, or ulcerations Neuro: normal mental status, normal strength, sensation, and motion Psych: alert; oriented to person, place and time, normally interactive and not anxious or depressed in appearance.   All labs reviewed with patient. Results for orders placed or performed in visit on 07/09/20  Lipid panel  Result Value Ref Range   Cholesterol 135 0 - 200 mg/dL   Triglycerides 78.6 0.0 - 149.0 mg/dL   HDL 76.72 >09.47 mg/dL   VLDL 09.6 0.0 - 28.3  mg/dL   LDL Cholesterol 62 0 - 99 mg/dL   Total CHOL/HDL Ratio 2    NonHDL 76.17   Hepatic function panel  Result Value Ref Range   Total Bilirubin 0.6 0.2 - 1.2 mg/dL   Bilirubin, Direct 0.1 0.0 - 0.3 mg/dL   Alkaline Phosphatase 61 39 - 117 U/L   AST 19 0 - 37 U/L   ALT 25 0 - 35 U/L   Total Protein 6.6 6.0 - 8.3 g/dL   Albumin 4.1 3.5 - 5.2 g/dL  Basic metabolic panel  Result Value Ref Range   Sodium 139 135 - 145 mEq/L   Potassium 4.0 3.5 - 5.1 mEq/L   Chloride 104 96 - 112 mEq/L   CO2 29 19 - 32 mEq/L   Glucose, Bld 94 70 - 99 mg/dL   BUN 13 6 - 23 mg/dL   Creatinine, Ser 6.62 0.40 - 1.20 mg/dL   GFR 94.76 >54.65 mL/min   Calcium 9.6 8.4 - 10.5  mg/dL  CBC with Differential/Platelet  Result Value Ref Range   WBC 5.1 4.0 - 10.5 K/uL   RBC 4.42 3.87 - 5.11 Mil/uL   Hemoglobin 13.5 12.0 - 15.0 g/dL   HCT 95.640.1 21.336.0 - 08.646.0 %   MCV 90.9 78.0 - 100.0 fl   MCHC 33.6 30.0 - 36.0 g/dL   RDW 57.813.4 46.911.5 - 62.915.5 %   Platelets 213.0 150.0 - 400.0 K/uL   Neutrophils Relative % 61.5 43.0 - 77.0 %   Lymphocytes Relative 25.9 12.0 - 46.0 %   Monocytes Relative 8.1 3.0 - 12.0 %   Eosinophils Relative 3.4 0.0 - 5.0 %   Basophils Relative 1.1 0.0 - 3.0 %   Neutro Abs 3.1 1.4 - 7.7 K/uL   Lymphs Abs 1.3 0.7 - 4.0 K/uL   Monocytes Absolute 0.4 0.1 - 1.0 K/uL   Eosinophils Absolute 0.2 0.0 - 0.7 K/uL   Basophils Absolute 0.1 0.0 - 0.1 K/uL  Hemoglobin A1c  Result Value Ref Range   Hgb A1c MFr Bld 6.0 4.6 - 6.5 %  VITAMIN D 25 Hydroxy (Vit-D Deficiency, Fractures)  Result Value Ref Range   VITD 64.81 30.00 - 100.00 ng/mL  TSH  Result Value Ref Range   TSH 3.13 0.35 - 4.50 uIU/mL   No results found.  Assessment and Plan:     ICD-10-CM   1. Healthcare maintenance  Z00.00    Globally doing very well I would get Covid 2nd booster Start to work out more and eat well.  Health Maintenance Exam: The patient's preventative maintenance and recommended screening tests for an annual  wellness exam were reviewed in full today. Brought up to date unless services declined.  Counselled on the importance of diet, exercise, and its role in overall health and mortality. The patient's FH and SH was reviewed, including their home life, tobacco status, and drug and alcohol status.  Follow-up in 1 year for physical exam or additional follow-up below.  Follow-up: No follow-ups on file. Or follow-up in 1 year if not noted.  No future appointments.  No orders of the defined types were placed in this encounter.  Medications Discontinued During This Encounter  Medication Reason  . albuterol (PROVENTIL HFA;VENTOLIN HFA) 108 (90 Base) MCG/ACT inhaler No longer needed (for PRN medications)  . chlorpheniramine-HYDROcodone (TUSSIONEX) 10-8 MG/5ML SUER Completed Course  . Nirmatrelvir & Ritonavir (PAXLOVID) 20 x 150 MG & 10 x 100MG  TBPK Completed Course   No orders of the defined types were placed in this encounter.   Signed,  Elpidio GaleaSpencer T. Nozomi Mettler, MD   Allergies as of 08/04/2020      Reactions   Codeine Nausea Only      Medication List       Accurate as of Aug 04, 2020 11:56 AM. If you have any questions, ask your nurse or doctor.        STOP taking these medications   albuterol 108 (90 Base) MCG/ACT inhaler Commonly known as: VENTOLIN HFA Stopped by: Hannah BeatSpencer Ilene Witcher, MD   chlorpheniramine-HYDROcodone 10-8 MG/5ML Suer Commonly known as: TUSSIONEX Stopped by: Hannah BeatSpencer Franck Vinal, MD   Paxlovid 20 x 150 MG & 10 x 100MG  Tbpk Generic drug: Nirmatrelvir & Ritonavir Stopped by: Hannah BeatSpencer Elysia Grand, MD     TAKE these medications   amLODipine 5 MG tablet Commonly known as: NORVASC TAKE 1 TABLET BY MOUTH ONCE DAILY   atorvastatin 10 MG tablet Commonly known as: LIPITOR TAKE 1 TABLET BY MOUTH ONCE DAILY   dorzolamide 2 %  ophthalmic solution Commonly known as: TRUSOPT Place 1 drop into the right eye 2 (two) times daily.   GLUCOSAMINE CHONDR COMPLEX PO Take 1 tablet by  mouth daily.   hydrochlorothiazide 12.5 MG capsule Commonly known as: MICROZIDE TAKE 1 CAPSULE BY MOUTH ONCE DAILY   ICaps Areds 2 Caps Take 1 each by mouth 2 (two) times daily.   latanoprost 0.005 % ophthalmic solution Commonly known as: XALATAN   losartan 50 MG tablet Commonly known as: COZAAR TAKE 1 TABLET BY MOUTH ONCE DAILY   Spiriva HandiHaler 18 MCG inhalation capsule Generic drug: tiotropium INHALE CONTENTS OF ONE CAPSULES ONCE DAILY   Viactiv Calcium Plus D 650-12.5-40 MG-MCG-MCG Chew Generic drug: Calcium-Vitamin D-Vitamin K Chew 2 each by mouth daily.   Vitamin D 50 MCG (2000 UT) Caps Take by mouth 2 (two) times daily.

## 2020-08-12 ENCOUNTER — Other Ambulatory Visit: Payer: Self-pay | Admitting: Family Medicine

## 2020-08-13 NOTE — Telephone Encounter (Signed)
Pharmacy requests refill on: Atorvastatin 10 mg & Amlodipine 5 mg   LAST REFILL: 05/18/2020 (Q-90, R-0) LAST OV: 08/04/2020 NEXT OV: Not Scheduled  PHARMACY: [facility]

## 2020-09-06 ENCOUNTER — Other Ambulatory Visit: Payer: Self-pay | Admitting: Family Medicine

## 2020-09-14 ENCOUNTER — Other Ambulatory Visit: Payer: Self-pay | Admitting: Family Medicine

## 2020-11-18 ENCOUNTER — Telehealth: Payer: Self-pay | Admitting: Family Medicine

## 2020-11-19 MED ORDER — AMLODIPINE BESYLATE 5 MG PO TABS
ORAL_TABLET | ORAL | 1 refills | Status: DC
Start: 2020-11-19 — End: 2021-08-04

## 2021-01-20 DIAGNOSIS — H40053 Ocular hypertension, bilateral: Secondary | ICD-10-CM | POA: Diagnosis not present

## 2021-01-20 DIAGNOSIS — H353131 Nonexudative age-related macular degeneration, bilateral, early dry stage: Secondary | ICD-10-CM | POA: Diagnosis not present

## 2021-02-12 ENCOUNTER — Other Ambulatory Visit: Payer: Self-pay | Admitting: Family Medicine

## 2021-03-21 ENCOUNTER — Ambulatory Visit: Payer: Medicare PPO | Admitting: Dermatology

## 2021-03-21 ENCOUNTER — Other Ambulatory Visit: Payer: Self-pay

## 2021-03-21 DIAGNOSIS — L821 Other seborrheic keratosis: Secondary | ICD-10-CM | POA: Diagnosis not present

## 2021-03-21 DIAGNOSIS — L578 Other skin changes due to chronic exposure to nonionizing radiation: Secondary | ICD-10-CM | POA: Diagnosis not present

## 2021-03-21 DIAGNOSIS — L820 Inflamed seborrheic keratosis: Secondary | ICD-10-CM | POA: Diagnosis not present

## 2021-03-21 NOTE — Progress Notes (Signed)
° °  Follow-Up Visit   Subjective  Rebecca Hamilton is a 86 y.o. female who presents for the following: Follow-up (Patient here today for a spot at back. Patient reports that it is no longer bothering her today. ). The patient has spots, moles and lesions to be evaluated, some may be new or changing and the patient has concerns that these could be cancer.  The following portions of the chart were reviewed this encounter and updated as appropriate:  Tobacco   Allergies   Meds   Problems   Med Hx   Surg Hx   Fam Hx      Review of Systems: No other skin or systemic complaints except as noted in HPI or Assessment and Plan.  Objective  Well appearing patient in no apparent distress; mood and affect are within normal limits.  A focused examination was performed including upper extremities, including the arms, hands, fingers, and fingernails; back and trunk and axillary area and face. Relevant physical exam findings are noted in the Assessment and Plan.  right upper back x 1, left axilla x 2, left and right neck x 10 (13) Erythematous stuck-on, waxy papule or plaque   Assessment & Plan  Inflamed seborrheic keratosis (13) right upper back x 1, left axilla x 2, left and right neck x 10 Irritated  Itchy areas at left and right neck   Destruction of lesion - right upper back x 1, left axilla x 2, left and right neck x 10 Complexity: simple   Destruction method: cryotherapy   Informed consent: discussed and consent obtained   Timeout:  patient name, date of birth, surgical site, and procedure verified Lesion destroyed using liquid nitrogen: Yes   Region frozen until ice ball extended beyond lesion: Yes   Outcome: patient tolerated procedure well with no complications   Post-procedure details: wound care instructions given   Additional details:  Prior to procedure, discussed risks of blister formation, small wound, skin dyspigmentation, or rare scar following cryotherapy. Recommend Vaseline  ointment to treated areas while healing.  Seborrheic Keratoses - Stuck-on, waxy, tan-brown papules and/or plaques  - Benign-appearing - Discussed benign etiology and prognosis. - Observe - Call for any changes  Actinic Damage - chronic, secondary to cumulative UV radiation exposure/sun exposure over time - diffuse scaly erythematous macules with underlying dyspigmentation - Recommend daily broad spectrum sunscreen SPF 30+ to sun-exposed areas, reapply every 2 hours as needed.  - Recommend staying in the shade or wearing long sleeves, sun glasses (UVA+UVB protection) and wide brim hats (4-inch brim around the entire circumference of the hat). - Call for new or changing lesions.  Return if symptoms worsen or fail to improve.  IAsher Muir, CMA, am acting as scribe for Armida Sans, MD. Documentation: I have reviewed the above documentation for accuracy and completeness, and I agree with the above.  Armida Sans, MD

## 2021-03-21 NOTE — Patient Instructions (Addendum)
Seborrheic Keratosis ? ?What causes seborrheic keratoses? ?Seborrheic keratoses are harmless, common skin growths that first appear during adult life.  As time goes by, more growths appear.  Some people may develop a large number of them.  Seborrheic keratoses appear on both covered and uncovered body parts.  They are not caused by sunlight.  The tendency to develop seborrheic keratoses can be inherited.  They vary in color from skin-colored to gray, brown, or even black.  They can be either smooth or have a rough, warty surface.   ?Seborrheic keratoses are superficial and look as if they were stuck on the skin.  Under the microscope this type of keratosis looks like layers upon layers of skin.  That is why at times the top layer may seem to fall off, but the rest of the growth remains and re-grows.   ? ?Treatment ?Seborrheic keratoses do not need to be treated, but can easily be removed in the office.  Seborrheic keratoses often cause symptoms when they rub on clothing or jewelry.  Lesions can be in the way of shaving.  If they become inflamed, they can cause itching, soreness, or burning.  Removal of a seborrheic keratosis can be accomplished by freezing, burning, or surgery. ?If any spot bleeds, scabs, or grows rapidly, please return to have it checked, as these can be an indication of a skin cancer. ? ? ?Cryotherapy Aftercare ? ?Wash gently with soap and water everyday.   ?Apply Vaseline and Band-Aid daily until healed.  ? ? ?If You Need Anything After Your Visit ? ?If you have any questions or concerns for your doctor, please call our main line at [phone] and press option 4 to reach your doctor's medical assistant. If no one answers, please leave a voicemail as directed and we will return your call as soon as possible. Messages left after 4 pm will be answered the following business day.  ? ?You may also send us a message via MyChart. We typically respond to MyChart messages within 1-2 business  days. ? ?For prescription refills, please ask your pharmacy to contact our office. Our fax number is [phone]. ? ?If you have an urgent issue when the clinic is closed that cannot wait until the next business day, you can page your doctor at the number below.   ? ?Please note that while we do our best to be available for urgent issues outside of office hours, we are not available 24/7.  ? ?If you have an urgent issue and are unable to reach us, you may choose to seek medical care at your doctor's office, retail clinic, urgent care center, or emergency room. ? ?If you have a medical emergency, please immediately call 911 or go to the emergency department. ? ?Pager Numbers ? ?- Dr. Kowalski: [phone] ? ?- Dr. Moye: [phone] ? ?- Dr. Stewart: [phone] ? ?In the event of inclement weather, please call our main line at [phone] for an update on the status of any delays or closures. ? ?Dermatology Medication Tips: ?Please keep the boxes that topical medications come in in order to help keep track of the instructions about where and how to use these. Pharmacies typically print the medication instructions only on the boxes and not directly on the medication tubes.  ? ?If your medication is too expensive, please contact our office at [phone] option 4 or send us a message through MyChart.  ? ?We are unable to tell what your co-pay for medications will be in advance   as this is different depending on your insurance coverage. However, we may be able to find a substitute medication at lower cost or fill out paperwork to get insurance to cover a needed medication.  ? ?If a prior authorization is required to get your medication covered by your insurance company, please allow us 1-2 business days to complete this process. ? ?Drug prices often vary depending on where the prescription is filled and some pharmacies may offer cheaper prices. ? ?The website www.goodrx.com contains coupons for medications through  different pharmacies. The prices here do not account for what the cost may be with help from insurance (it may be cheaper with your insurance), but the website can give you the price if you did not use any insurance.  ?- You can print the associated coupon and take it with your prescription to the pharmacy.  ?- You may also stop by our office during regular business hours and pick up a GoodRx coupon card.  ?- If you need your prescription sent electronically to a different pharmacy, notify our office through [facility] MyChart or by phone at [phone] option 4. ? ? ? ? ?Si Usted Necesita Algo Despu?s de Su Visita ? ?Tambi?n puede enviarnos un mensaje a trav?s de MyChart. Por lo general respondemos a los mensajes de MyChart en el transcurso de 1 a 2 d?as h?biles. ? ?Para renovar recetas, por favor pida a su farmacia que se ponga en contacto con nuestra oficina. Nuestro n?mero de fax es el [phone]. ? ?Si tiene un asunto urgente cuando la cl?nica est? cerrada y que no puede esperar hasta el siguiente d?a h?bil, puede llamar/localizar a su doctor(a) al n?mero que aparece a continuaci?n.  ? ?Por favor, tenga en cuenta que aunque hacemos todo lo posible para estar disponibles para asuntos urgentes fuera del horario de oficina, no estamos disponibles las 24 horas del d?a, los 7 d?as de la semana.  ? ?Si tiene un problema urgente y no puede comunicarse con nosotros, puede optar por buscar atenci?n m?dica  en el consultorio de su doctor(a), en una cl?nica privada, en un centro de atenci?n urgente o en una sala de emergencias. ? ?Si tiene una emergencia m?dica, por favor llame inmediatamente al 911 o vaya a la sala de emergencias. ? ?N?meros de b?per ? ?- Dr. Kowalski: [phone] ? ?- Dra. Moye: [phone] ? ?- Dra. Stewart: [phone] ? ?En caso de inclemencias del tiempo, por favor llame a nuestra l?nea principal al [phone] para una actualizaci?n sobre el estado de cualquier retraso o cierre. ? ?Consejos  para la medicaci?n en dermatolog?a: ?Por favor, guarde las cajas en las que vienen los medicamentos de uso t?pico para ayudarle a seguir las instrucciones sobre d?nde y c?mo usarlos. Las farmacias generalmente imprimen las instrucciones del medicamento s?lo en las cajas y no directamente en los tubos del medicamento.  ? ?Si su medicamento es muy caro, por favor, p?ngase en contacto con nuestra oficina llamando al [phone] y presione la opci?n 4 o env?enos un mensaje a trav?s de MyChart.  ? ?No podemos decirle cu?l ser? su copago por los medicamentos por adelantado ya que esto es diferente dependiendo de la cobertura de su seguro. Sin embargo, es posible que podamos encontrar un medicamento sustituto a menor costo o llenar un formulario para que el seguro cubra el medicamento que se considera necesario.  ? ?Si se requiere una autorizaci?n previa para que su compa??a de seguros cubra su medicamento, por favor perm?tanos de 1 a 2 d?as   h?biles para completar este proceso. ? ?Los precios de los medicamentos var?an con frecuencia dependiendo del lugar de d?nde se surte la receta y alguna farmacias pueden ofrecer precios m?s baratos. ? ?El sitio web www.goodrx.com tiene cupones para medicamentos de diferentes farmacias. Los precios aqu? no tienen en cuenta lo que podr?a costar con la ayuda del seguro (puede ser m?s barato con su seguro), pero el sitio web puede darle el precio si no utiliz? ning?n seguro.  ?- Puede imprimir el cup?n correspondiente y llevarlo con su receta a la farmacia.  ?- Tambi?n puede pasar por nuestra oficina durante el horario de atenci?n regular y recoger una tarjeta de cupones de GoodRx.  ?- Si necesita que su receta se env?e electr?nicamente a una farmacia diferente, informe a nuestra oficina a trav?s de MyChart de [facility] o por tel?fono llamando al [phone] y presione la opci?n 4. ? ?

## 2021-03-22 ENCOUNTER — Other Ambulatory Visit: Payer: Self-pay | Admitting: Family Medicine

## 2021-03-22 ENCOUNTER — Encounter: Payer: Self-pay | Admitting: Dermatology

## 2021-03-22 DIAGNOSIS — Z1231 Encounter for screening mammogram for malignant neoplasm of breast: Secondary | ICD-10-CM

## 2021-04-18 ENCOUNTER — Telehealth: Payer: Self-pay | Admitting: Family Medicine

## 2021-04-18 NOTE — Telephone Encounter (Signed)
Patient has not been seen since May 2022.  Last office visit 08/04/2020 for CPE.  Last refilled :  Not on current medication list.  No future appointments with PCP.    Please triage to see why patient is requesting refill on cough medicine.

## 2021-04-18 NOTE — Telephone Encounter (Signed)
What is the name of the medication? chlorpheniramine-HYDROcodone (Bear) 10-8 MG/5ML SUER [id number]  Have you contacted your pharmacy to request a refill? Pt would like a refill for this script.   Which pharmacy would you like this sent to? [facility] [address]  [place], [place] [address]  Phone:  [phone]  Fax:  [phone]    Patient notified that their request is being sent to the clinical staff for review and that they should receive a call once it is complete. If they do not receive a call within 72 hours they can check with their pharmacy or our office.

## 2021-04-18 NOTE — Telephone Encounter (Signed)
I spoke with pt;pt said starting on 04/15/24 she began with dry cough that is a very deep cough, no CP or wheezing but earlier this afternoon pt was having an intense coughing episode where pt could not catch her breath. Pt was very SOB after coughing episode but SOB did subside. Pt had hx of pneumonia in 2020. Pt said she is not sure if has fever or not. Covid test on 04/16/21 was negative. Pt also has runny nose and S/T that it does hurt to swallow. Pt also has head congestion. Pt is going to [facility] now for eval and any needed testing. Pt appreciative and pt will cb to update her condition. Sending note to Dr Lorelei Pont and Butch Penny CMA and since [place] teams me I will teams Butch Penny so aware of pt condition.

## 2021-04-19 ENCOUNTER — Other Ambulatory Visit: Payer: Self-pay

## 2021-04-19 ENCOUNTER — Ambulatory Visit (INDEPENDENT_AMBULATORY_CARE_PROVIDER_SITE_OTHER): Payer: Medicare PPO

## 2021-04-19 ENCOUNTER — Ambulatory Visit
Admission: EM | Admit: 2021-04-19 | Discharge: 2021-04-19 | Disposition: A | Discharge status: Other Acute Inpatient Hospital | Payer: Medicare PPO

## 2021-04-19 ENCOUNTER — Encounter: Payer: Self-pay | Admitting: Emergency Medicine

## 2021-04-19 ENCOUNTER — Telehealth (INDEPENDENT_AMBULATORY_CARE_PROVIDER_SITE_OTHER): Payer: Medicare PPO | Admitting: Nurse Practitioner

## 2021-04-19 ENCOUNTER — Ambulatory Visit
Admission: EM | Admit: 2021-04-19 | Discharge: 2021-04-19 | Disposition: A | Discharge status: Home / Self Care | Payer: Medicare PPO | Attending: Emergency Medicine | Admitting: Emergency Medicine

## 2021-04-19 DIAGNOSIS — R051 Acute cough: Secondary | ICD-10-CM | POA: Insufficient documentation

## 2021-04-19 DIAGNOSIS — J209 Acute bronchitis, unspecified: Secondary | ICD-10-CM | POA: Diagnosis not present

## 2021-04-19 DIAGNOSIS — R059 Cough, unspecified: Secondary | ICD-10-CM

## 2021-04-19 DIAGNOSIS — R058 Other specified cough: Secondary | ICD-10-CM

## 2021-04-19 MED ORDER — HYDROCODONE BIT-HOMATROP MBR 5-1.5 MG/5ML PO SOLN: 5.0000 mL | Freq: Every evening | ORAL | 0 refills | Status: AC | PRN

## 2021-04-19 MED ORDER — AZITHROMYCIN 250 MG PO TABS: 250.0000 mg | ORAL_TABLET | Freq: Every day | ORAL | 0 refills | Status: DC

## 2021-04-19 MED ORDER — BENZONATATE 100 MG PO CAPS: 100.0000 mg | ORAL_CAPSULE | Freq: Two times a day (BID) | ORAL | 0 refills | Status: DC | PRN

## 2021-04-19 NOTE — Discharge Instructions (Addendum)
Take the Zithromax as directed.  Follow up with your primary care provider.    

## 2021-04-19 NOTE — Progress Notes (Signed)
Patient ID: [name], female    DOB: Jul 01, 1935, 86 y.o.   MRN: [id number]  Virtual visit completed through [employer], a video enabled telemedicine application. Due to national recommendations of social distancing due to COVID-19, a virtual visit is felt to be most appropriate for this patient at this time. Reviewed limitations, risks, security and privacy concerns of performing a virtual visit and the availability of in person appointments. I also reviewed that there may be a patient responsible charge related to this service. The patient agreed to proceed.   Patient location: home Provider location: [place] at [facility], office Persons participating in this virtual visit: patient, provider   If any vitals were documented, they were collected by patient at home unless specified below.    There were no vitals taken for this visit.   CC:  Subjective:   HPI: Rebecca Hamilton is a 86 y.o. female presenting on 04/19/2021 for Cough (Had Urgent care visit earlier today, would like to get a cough syrup-coughing up yellow/green phlegm. She was prescribed ZpAK by urgent care earlier today. Patient had left over Tussionex from about 2 years ago she used for the cough and Delsym.)   Had an urgent care visit earlier today and was dx with bronchitis. States that she was using the Tussinex and delsym. States that delsym has helped some but when she gets into a hard coughing fit it does not help. She has used Tussinex in the past with good relief. States that she inquired about it at the [facility] but they deferred to patients PCP in regards to the cough medication.     Relevant past medical, surgical, family and social history reviewed and updated as indicated. Interim medical history since our last visit reviewed. Allergies and medications reviewed and updated. Outpatient Medications Prior to Visit  Medication Sig Dispense Refill   amLODipine (NORVASC) 5 MG tablet TAKE 1 TABLET BY MOUTH ONCE  DAILY 90 tablet 1   atorvastatin (LIPITOR) 10 MG tablet TAKE 1 TABLET BY MOUTH ONCE DAILY (NEED OFFICE VISIT) 90 tablet 1   azithromycin (ZITHROMAX) 250 MG tablet Take 1 tablet (250 mg total) by mouth daily. Take first 2 tablets together, then 1 every day until finished. 6 tablet 0   Calcium-Vitamin D-Vitamin K (VIACTIV CALCIUM PLUS D) 650-12.5-40 MG-MCG-MCG CHEW Chew 2 each by mouth daily.     Cholecalciferol (VITAMIN D) 2000 UNITS CAPS Take by mouth 2 (two) times daily.     dorzolamide (TRUSOPT) 2 % ophthalmic solution Place 1 drop into the right eye 2 (two) times daily.     Glucosamine-Chondroitin (GLUCOSAMINE CHONDR COMPLEX PO) Take 1 tablet by mouth daily.     hydrochlorothiazide (MICROZIDE) 12.5 MG capsule TAKE 1 CAPSULE BY MOUTH ONCE DAILY 90 capsule 3   latanoprost (XALATAN) 0.005 % ophthalmic solution      losartan (COZAAR) 50 MG tablet TAKE 1 TABLET BY MOUTH ONCE DAILY 90 tablet 3   Multiple Vitamins-Minerals (ICAPS AREDS 2) CAPS Take 1 each by mouth 2 (two) times daily.     SPIRIVA HANDIHALER 18 MCG inhalation capsule INHALE CONTENTS OF ONE CAPSULES ONCE DAILY 90 capsule 3   No facility-administered medications prior to visit.     Per HPI unless specifically indicated in ROS section below Review of Systems  Constitutional:  Positive for chills. Negative for appetite change, fatigue and fever.  HENT:  Positive for congestion, postnasal drip, sinus pressure and sore throat. Negative for ear discharge and ear pain.  Respiratory:  Positive for cough (green). Negative for shortness of breath.   Cardiovascular:  Negative for chest pain.  Gastrointestinal:  Negative for abdominal pain, diarrhea, nausea and vomiting.  Musculoskeletal:  Negative for arthralgias and myalgias.  Neurological:  Positive for headaches. Negative for dizziness and light-headedness.  Objective:  There were no vitals taken for this visit.  Wt Readings from Last 3 Encounters:  08/04/20 145 lb 12 oz (66.1 kg)   04/23/19 147 lb 8 oz (66.9 kg)  11/05/18 146 lb 1.6 oz (66.3 kg)       Physical exam: Gen: alert, NAD, not ill appearing Pulm: speaks in complete sentences without increased work of breathing Psych: normal mood, normal thought content      Results for orders placed or performed in visit on 07/09/20  Lipid panel  Result Value Ref Range   Cholesterol 135 0 - 200 mg/dL   Triglycerides 69.0 0.0 - 149.0 mg/dL   HDL 58.60 >39.00 mg/dL   VLDL 13.8 0.0 - 40.0 mg/dL   LDL Cholesterol 62 0 - 99 mg/dL   Total CHOL/HDL Ratio 2    NonHDL 76.17   Hepatic function panel  Result Value Ref Range   Total Bilirubin 0.6 0.2 - 1.2 mg/dL   Bilirubin, Direct 0.1 0.0 - 0.3 mg/dL   Alkaline Phosphatase 61 39 - 117 U/L   AST 19 0 - 37 U/L   ALT 25 0 - 35 U/L   Total Protein 6.6 6.0 - 8.3 g/dL   Albumin 4.1 3.5 - 5.2 g/dL  Basic metabolic panel  Result Value Ref Range   Sodium 139 135 - 145 mEq/L   Potassium 4.0 3.5 - 5.1 mEq/L   Chloride 104 96 - 112 mEq/L   CO2 29 19 - 32 mEq/L   Glucose, Bld 94 70 - 99 mg/dL   BUN 13 6 - 23 mg/dL   Creatinine, Ser 0.78 0.40 - 1.20 mg/dL   GFR 70.48 >60.00 mL/min   Calcium 9.6 8.4 - 10.5 mg/dL  CBC with Differential/Platelet  Result Value Ref Range   WBC 5.1 4.0 - 10.5 K/uL   RBC 4.42 3.87 - 5.11 Mil/uL   Hemoglobin 13.5 12.0 - 15.0 g/dL   HCT 40.1 36.0 - 46.0 %   MCV 90.9 78.0 - 100.0 fl   MCHC 33.6 30.0 - 36.0 g/dL   RDW 13.4 11.5 - 15.5 %   Platelets 213.0 150.0 - 400.0 K/uL   Neutrophils Relative % 61.5 43.0 - 77.0 %   Lymphocytes Relative 25.9 12.0 - 46.0 %   Monocytes Relative 8.1 3.0 - 12.0 %   Eosinophils Relative 3.4 0.0 - 5.0 %   Basophils Relative 1.1 0.0 - 3.0 %   Neutro Abs 3.1 1.4 - 7.7 K/uL   Lymphs Abs 1.3 0.7 - 4.0 K/uL   Monocytes Absolute 0.4 0.1 - 1.0 K/uL   Eosinophils Absolute 0.2 0.0 - 0.7 K/uL   Basophils Absolute 0.1 0.0 - 0.1 K/uL  Hemoglobin A1c  Result Value Ref Range   Hgb A1c MFr Bld 6.0 4.6 - 6.5 %  VITAMIN D 25  Hydroxy (Vit-D Deficiency, Fractures)  Result Value Ref Range   VITD 64.81 30.00 - 100.00 ng/mL  TSH  Result Value Ref Range   TSH 3.13 0.35 - 4.50 uIU/mL   Assessment & Plan:   Problem List Items Addressed This Visit       Other   Acute cough - Primary    Patient was seen and evaluated  in person earlier today at urgent care diagnosed with acute bronchitis and placed on a Z-Pak.  Patient requested some cough syrup urgent care deferred to patient's PCP in that regard.  Patient has had testing next in the past and tolerated well.  I will write some Hycodan for nightly as needed use.  Discussed with patient that the possibility of having dizziness and lightheaded with this medication she needs to be careful.  We will supplement her with Ladona Ridgel throughout the day.  Patient acknowledged and agreed with this plan.  Follow-up if no improvement.  Signs and symptoms reviewed when to seek urgent or emergent health care.      Relevant Medications   HYDROcodone bit-homatropine (HYCODAN) 5-1.5 MG/5ML syrup   benzonatate (TESSALON) 100 MG capsule     No orders of the defined types were placed in this encounter.  No orders of the defined types were placed in this encounter.   I discussed the assessment and treatment plan with the patient. The patient was provided an opportunity to ask questions and all were answered. The patient agreed with the plan and demonstrated an understanding of the instructions. The patient was advised to call back or seek an in-person evaluation if the symptoms worsen or if the condition fails to improve as anticipated.  Follow up plan: No follow-ups on file.  Romilda Garret, NP

## 2021-04-19 NOTE — Assessment & Plan Note (Signed)
Patient was seen and evaluated in person earlier today at urgent care diagnosed with acute bronchitis and placed on a Z-Pak.  Patient requested some cough syrup urgent care deferred to patient's PCP in that regard.  Patient has had testing next in the past and tolerated well.  I will write some Hycodan for nightly as needed use.  Discussed with patient that the possibility of having dizziness and lightheaded with this medication she needs to be careful.  We will supplement her with Jerilynn Som throughout the day.  Patient acknowledged and agreed with this plan.  Follow-up if no improvement.  Signs and symptoms reviewed when to seek urgent or emergent health care.

## 2021-04-19 NOTE — ED Triage Notes (Signed)
Pt here with cough x 4 days. Requesting a refill of a specific cough medication.

## 2021-04-19 NOTE — ED Provider Notes (Signed)
Rebecca Hamilton    CSN: [id number] Arrival date & time: 04/19/21  0841      History   Chief Complaint Chief Complaint  Patient presents with   Cough    HPI Rebecca Hamilton is a 86 y.o. female.  Patient presents with 4-day history of cough which has become productive of yellow sputum in the last couple of days.  She also reports mild shortness of breath and sore throat with coughing.  She denies fever, chills, chest pain, vomiting, diarrhea, or other symptoms.  Treatment at home with cough syrup.  Her medical history includes COPD, hypertension, GERD.  The history is provided by the patient and medical records.   Past Medical History:  Diagnosis Date   Colon polyps    COPD (chronic obstructive pulmonary disease) (HCC)    GERD (gastroesophageal reflux disease)    HTN (hypertension)    Hyperlipidemia    Osteopenia     Patient Active Problem List   Diagnosis Date Noted   OSTEOPENIA 06/25/2008   Hyperlipidemia 05/22/2008   UNSPECIFIED ANEMIA 05/22/2008   HYPERTENSION 05/22/2008   COPD (chronic obstructive pulmonary disease) ([place]) 05/22/2008   GERD 05/22/2008   COLONIC POLYPS, HX OF 05/22/2008    Past Surgical History:  Procedure Laterality Date   CATARACT EXTRACTION W/PHACO Left 10/08/2018   Procedure: CATARACT EXTRACTION PHACO AND INTRAOCULAR LENS PLACEMENT ([place]) LEFT;  Surgeon: Birder Robson, MD;  Location: [place];  Service: Ophthalmology;  Laterality: Left;   CATARACT EXTRACTION W/PHACO Right 11/05/2018   Procedure: CATARACT EXTRACTION PHACO AND INTRAOCULAR LENS PLACEMENT ([facility]) - right  00:34  15.3%  5.32;  Surgeon: Birder Robson, MD;  Location: [place];  Service: Ophthalmology;  Laterality: Right;   COLONOSCOPY     DENTAL SURGERY     EYE SURGERY      OB History   No obstetric history on file.      Home Medications    Prior to Admission medications   Medication Sig Start Date End Date Taking? Authorizing Provider   azithromycin (ZITHROMAX) 250 MG tablet Take 1 tablet (250 mg total) by mouth daily. Take first 2 tablets together, then 1 every day until finished. 04/19/21  Yes Sharion Balloon, NP  amLODipine (NORVASC) 5 MG tablet TAKE 1 TABLET BY MOUTH ONCE DAILY 11/19/20   Copland, Frederico Hamman, MD  atorvastatin (LIPITOR) 10 MG tablet TAKE 1 TABLET BY MOUTH ONCE DAILY (NEED OFFICE VISIT) 02/13/21   Copland, Frederico Hamman, MD  Calcium-Vitamin D-Vitamin K (VIACTIV CALCIUM PLUS D) 650-12.5-40 MG-MCG-MCG CHEW Chew 2 each by mouth daily.    [provider]  Cholecalciferol (VITAMIN D) 2000 UNITS CAPS Take by mouth 2 (two) times daily.    [provider]  dorzolamide (TRUSOPT) 2 % ophthalmic solution Place 1 drop into the right eye 2 (two) times daily.    [provider]  Glucosamine-Chondroitin (GLUCOSAMINE CHONDR COMPLEX PO) Take 1 tablet by mouth daily.    [provider]  hydrochlorothiazide (MICROZIDE) 12.5 MG capsule TAKE 1 CAPSULE BY MOUTH ONCE DAILY 09/06/20   Copland, Frederico Hamman, MD  latanoprost (XALATAN) 0.005 % ophthalmic solution  11/16/15   [provider]  losartan (COZAAR) 50 MG tablet TAKE 1 TABLET BY MOUTH ONCE DAILY 09/14/20   Copland, Frederico Hamman, MD  Multiple Vitamins-Minerals (ICAPS AREDS 2) CAPS Take 1 each by mouth 2 (two) times daily.    [provider]  SPIRIVA HANDIHALER 18 MCG inhalation capsule INHALE CONTENTS OF ONE CAPSULES ONCE DAILY 09/06/20  Copland, Frederico Hamman, MD    Family History Family History  Problem Relation Age of Onset   Diabetes Father    Heart disease Father    Diabetes Brother    Heart disease Mother    Breast cancer Neg Hx     Social History Social History   Tobacco Use   Smoking status: Former    Types: Cigarettes    Quit date: 1988    Years since quitting: 35.1   Smokeless tobacco: Never   Tobacco comments:    quit 1988  Vaping Use   Vaping Use: Never used  Substance Use Topics   Alcohol use: Yes    Alcohol/week: 2.0 standard  drinks    Types: 1 Glasses of wine, 1 Shots of liquor per week    Comment: daily   Drug use: No     Allergies   Codeine   Review of Systems Review of Systems  Constitutional:  Negative for chills and fever.  HENT:  Positive for sore throat. Negative for ear pain.   Respiratory:  Positive for cough and shortness of breath.   Cardiovascular:  Negative for chest pain and palpitations.  Gastrointestinal:  Negative for diarrhea and vomiting.  Skin:  Negative for color change and rash.  All other systems reviewed and are negative.   Physical Exam Triage Vital Signs ED Triage Vitals [04/19/21 0948]  Enc Vitals Group     BP 103/68     Pulse Rate 79     Resp 18     Temp 98.9 F (37.2 C)     Temp src      SpO2 95 %     Weight      Height      Head Circumference      Peak Flow      Pain Score      Pain Loc      Pain Edu?      Excl. in [place]?    No data found.  Updated Vital Signs BP 103/68    Pulse 79    Temp 98.9 F (37.2 C)    Resp 18    SpO2 95%   Visual Acuity Right Eye Distance:   Left Eye Distance:   Bilateral Distance:    Right Eye Near:   Left Eye Near:    Bilateral Near:     Physical Exam Vitals and nursing note reviewed.  Constitutional:      General: She is not in acute distress.    Appearance: She is well-developed.  HENT:     Head: Normocephalic and atraumatic.     Right Ear: Tympanic membrane normal.     Left Ear: Tympanic membrane normal.     Nose: Nose normal.     Mouth/Throat:     Mouth: Mucous membranes are moist.     Pharynx: Oropharynx is clear.  Cardiovascular:     Rate and Rhythm: Normal rate and regular rhythm.     Heart sounds: Normal heart sounds.  Pulmonary:     Effort: Pulmonary effort is normal. No respiratory distress.     Breath sounds: Rhonchi present.     Comments: Scattered rhonchi throughout.  Musculoskeletal:     Cervical back: Neck supple.  Skin:    General: Skin is warm and dry.  Neurological:     Mental Status:  She is alert.  Psychiatric:        Mood and Affect: Mood normal.        Behavior:  Behavior normal.     UC Treatments / Results  Labs (all labs ordered are listed, but only abnormal results are displayed) Labs Reviewed  COVID-19, FLU A+B AND RSV    EKG   Radiology DG Chest 2 View  Result Date: 04/19/2021 CLINICAL DATA:  Productive cough for the past 4 days. EXAM: CHEST - 2 VIEW COMPARISON:  None. FINDINGS: The heart size and mediastinal contours are within normal limits. Normal pulmonary vascularity. Minimal linear scarring/atelectasis in the lingula. No focal consolidation, pleural effusion, or pneumothorax. No acute osseous abnormality. IMPRESSION: 1. No acute cardiopulmonary disease. Electronically Signed   By: Titus Dubin M.D.   On: 04/19/2021 10:25    Procedures Procedures (including critical care time)  Medications Ordered in UC Medications - No data to display  Initial Impression / Assessment and Plan / UC Course  I have reviewed the triage vital signs and the nursing notes.  Pertinent labs & imaging results that were available during my care of the patient were reviewed by me and considered in my medical decision making (see chart for details).    Productive cough, acute bronchitis.  CXR negative. COVID, Flu, and RSV pending.  Instructed patient to self quarantine per CDC guidelines.  Treating with Zithromax.  Discussed symptomatic treatment including Tylenol, rest, hydration.  Instructed patient to follow up with PCP for a recheck in 2-3 days.  Patient agrees to plan of care.   Final Clinical Impressions(s) / UC Diagnoses   Final diagnoses:  Productive cough  Acute bronchitis, unspecified organism     Discharge Instructions      Take the Zithromax as directed.  Follow up with your primary care provider.        ED Prescriptions     Medication Sig Dispense Auth. Provider   azithromycin (ZITHROMAX) 250 MG tablet Take 1 tablet (250 mg total) by mouth  daily. Take first 2 tablets together, then 1 every day until finished. 6 tablet Sharion Balloon, NP      I have reviewed the PDMP during this encounter.   Sharion Balloon, NP 04/19/21 1036

## 2021-04-21 LAB — COVID-19, FLU A+B AND RSV
Influenza A, NAA: NOT DETECTED
Influenza B, NAA: NOT DETECTED
RSV, NAA: NOT DETECTED
SARS-CoV-2, NAA: NOT DETECTED

## 2021-04-28 ENCOUNTER — Ambulatory Visit
Admission: RE | Admit: 2021-04-28 | Discharge: 2021-04-28 | Disposition: A | Discharge status: Home / Self Care | Payer: Medicare PPO | Source: Ambulatory Visit | Attending: Family Medicine | Admitting: Family Medicine

## 2021-04-28 ENCOUNTER — Other Ambulatory Visit: Payer: Self-pay

## 2021-04-28 DIAGNOSIS — Z1231 Encounter for screening mammogram for malignant neoplasm of breast: Secondary | ICD-10-CM | POA: Diagnosis not present

## 2021-05-31 ENCOUNTER — Other Ambulatory Visit: Payer: Self-pay | Admitting: Family Medicine

## 2021-06-15 ENCOUNTER — Other Ambulatory Visit: Payer: Medicare PPO

## 2021-06-23 ENCOUNTER — Other Ambulatory Visit (INDEPENDENT_AMBULATORY_CARE_PROVIDER_SITE_OTHER): Payer: Medicare PPO

## 2021-06-23 DIAGNOSIS — R5383 Other fatigue: Secondary | ICD-10-CM | POA: Diagnosis not present

## 2021-06-23 DIAGNOSIS — E7800 Pure hypercholesterolemia, unspecified: Secondary | ICD-10-CM | POA: Diagnosis not present

## 2021-06-23 DIAGNOSIS — E559 Vitamin D deficiency, unspecified: Secondary | ICD-10-CM

## 2021-06-23 DIAGNOSIS — Z131 Encounter for screening for diabetes mellitus: Secondary | ICD-10-CM | POA: Diagnosis not present

## 2021-06-23 DIAGNOSIS — Z79899 Other long term (current) drug therapy: Secondary | ICD-10-CM | POA: Diagnosis not present

## 2021-06-23 LAB — HEMOGLOBIN A1C: Hgb A1c MFr Bld: 6 % (ref 4.6–6.5)

## 2021-06-23 LAB — CBC WITH DIFFERENTIAL/PLATELET
Basophils Absolute: 0 10*3/uL (ref 0.0–0.1)
Basophils Relative: 0.7 % (ref 0.0–3.0)
Eosinophils Absolute: 0.2 10*3/uL (ref 0.0–0.7)
Eosinophils Relative: 3.2 % (ref 0.0–5.0)
HCT: 40.3 % (ref 36.0–46.0)
Hemoglobin: 13.3 g/dL (ref 12.0–15.0)
Lymphocytes Relative: 25.2 % (ref 12.0–46.0)
Lymphs Abs: 1.4 10*3/uL (ref 0.7–4.0)
MCHC: 33 g/dL (ref 30.0–36.0)
MCV: 90.9 fl (ref 78.0–100.0)
Monocytes Absolute: 0.5 10*3/uL (ref 0.1–1.0)
Monocytes Relative: 8.5 % (ref 3.0–12.0)
Neutro Abs: 3.4 10*3/uL (ref 1.4–7.7)
Neutrophils Relative %: 62.4 % (ref 43.0–77.0)
Platelets: 217 10*3/uL (ref 150.0–400.0)
RBC: 4.43 Mil/uL (ref 3.87–5.11)
RDW: 14.2 % (ref 11.5–15.5)
WBC: 5.4 10*3/uL (ref 4.0–10.5)

## 2021-06-23 LAB — BASIC METABOLIC PANEL
BUN: 10 mg/dL (ref 6–23)
CO2: 28 mEq/L (ref 19–32)
Calcium: 9.5 mg/dL (ref 8.4–10.5)
Chloride: 101 mEq/L (ref 96–112)
Creatinine, Ser: 0.86 mg/dL (ref 0.40–1.20)
GFR: 62.27 mL/min (ref >60.00)
Glucose, Bld: 96 mg/dL (ref 70–99)
Potassium: 3.8 mEq/L (ref 3.5–5.1)
Sodium: 134 mEq/L — ABNORMAL LOW (ref 135–145)

## 2021-06-23 LAB — LIPID PANEL
Cholesterol: 143 mg/dL (ref 0–200)
HDL: 68.8 mg/dL (ref >39.00)
LDL Cholesterol: 58 mg/dL (ref 0–99)
NonHDL: 74.16
Total CHOL/HDL Ratio: 2
Triglycerides: 79 mg/dL (ref 0.0–149.0)
VLDL: 15.8 mg/dL (ref 0.0–40.0)

## 2021-06-23 LAB — HEPATIC FUNCTION PANEL
ALT: 20 U/L (ref 0–35)
AST: 19 U/L (ref 0–37)
Albumin: 4.1 g/dL (ref 3.5–5.2)
Alkaline Phosphatase: 57 U/L (ref 39–117)
Bilirubin, Direct: 0.1 mg/dL (ref 0.0–0.3)
Total Bilirubin: 0.6 mg/dL (ref 0.2–1.2)
Total Protein: 6.5 g/dL (ref 6.0–8.3)

## 2021-06-23 LAB — VITAMIN D 25 HYDROXY (VIT D DEFICIENCY, FRACTURES): VITD: 42.83 ng/mL (ref 30.00–100.00)

## 2021-06-23 LAB — TSH: TSH: 3.27 u[IU]/mL (ref 0.35–5.50)

## 2021-06-24 ENCOUNTER — Other Ambulatory Visit: Payer: Medicare PPO

## 2021-06-30 ENCOUNTER — Encounter: Payer: Self-pay | Admitting: Family Medicine

## 2021-06-30 ENCOUNTER — Ambulatory Visit (INDEPENDENT_AMBULATORY_CARE_PROVIDER_SITE_OTHER): Payer: Medicare PPO | Admitting: Family Medicine

## 2021-06-30 VITALS — BP 100/60 | HR 75 | Temp 98.1°F | Ht 61.5 in | Wt 149.1 lb

## 2021-06-30 DIAGNOSIS — Z0000 Encounter for general adult medical examination without abnormal findings: Secondary | ICD-10-CM | POA: Diagnosis not present

## 2021-06-30 NOTE — Progress Notes (Signed)
? ? ?Venida Tsukamoto T. Hydie Langan, MD, [facility] ?Therapist, music at [facility] ?[address] ?[place], [address] ? ?Phone: [phone]  FAX: [phone] ? ?ZAYLAH BLECHA - 86 y.o. female  MRN [id number]  Date of Birth: 16-Nov-1935 ? ?Date: 06/30/2021  PCP: Owens Loffler, MD  Referral: Owens Loffler, MD ? ?Chief Complaint  ?Patient presents with  ? Medicare Wellness  ? ? ?This visit occurred during the SARS-CoV-2 public health emergency.  Safety protocols were in place, including screening questions prior to the visit, additional usage of staff PPE, and extensive cleaning of exam room while observing appropriate contact time as indicated for disinfecting solutions.  ? ?Patient Care Team: ?Owens Loffler, MD as PCP - General ?Owens Loffler, MD ?Subjective:  ? ?SHERRIANN SZUCH is a 86 y.o. pleasant patient who presents for a medicare wellness examination: ? ?Health Maintenance Summary Reviewed and updated, unless pt declines services. ? ?Tobacco History Reviewed. Non-smoker ?Alcohol: No concerns, no excessive use ?Exercise Habits: Some activity, rec at least 30 mins 5 times a week ?STD concerns: none ?Drug Use: None ?Birth control method: n/a ?Menses regular: n/a ?Lumps or breast concerns: no ?Breast Cancer Family History: no ? ?Still having a cough at night.  Usually at night.  ?S/p resp infetion 04/2021 ? ?Health Maintenance  ?Topic Date Due  ? INFLUENZA VACCINE  10/11/2021  ? MAMMOGRAM  04/28/2022  ? TETANUS/TDAP  08/04/2030  ? Pneumonia Vaccine 109+ Years old  Completed  ? DEXA SCAN  Completed  ? COVID-19 Vaccine  Completed  ? Zoster Vaccines- Shingrix  Completed  ? HPV VACCINES  Aged Out  ? ?Immunization History  ?Administered Date(s) Administered  ? Fluad Quad(high Dose 65+) 01/07/2019  ? Hepatitis A 06/26/1995, 12/26/1995  ? Hepatitis B 12/01/2005, 01/01/2006, 07/06/2006  ? Influenza Split 12/29/2011  ? Influenza Whole 03/14/2007  ? Influenza,inj,Quad PF,6+ Mos 12/03/2013,  01/11/2015, 01/18/2016, 01/04/2017, 12/24/2017  ? Influenza-Unspecified 12/11/2012  ? Moderna Covid-19 Vaccine Bivalent Booster 70yrs & up 12/10/2020  ? PFIZER(Purple Top)SARS-COV-2 Vaccination 03/19/2019, 04/08/2019, 12/12/2019  ? Pneumococcal Conjugate-13 12/03/2013  ? Pneumococcal Polysaccharide-23 03/13/2005, 12/01/2005  ? Td 03/13/2005, 12/01/2005  ? Tdap 08/03/2020  ? Zoster Recombinat (Shingrix) 12/27/2017, 03/25/2018  ? Zoster, Live 07/06/2006  ? ? ?Patient Active Problem List  ? Diagnosis Date Noted  ? OSTEOPENIA 06/25/2008  ? Hyperlipidemia 05/22/2008  ? UNSPECIFIED ANEMIA 05/22/2008  ? HYPERTENSION 05/22/2008  ? COPD (chronic obstructive pulmonary disease) ([place]) 05/22/2008  ? GERD 05/22/2008  ? COLONIC POLYPS, HX OF 05/22/2008  ? ? ?Past Medical History:  ?Diagnosis Date  ? Colon polyps   ? COPD (chronic obstructive pulmonary disease) ([place])   ? GERD (gastroesophageal reflux disease)   ? HTN (hypertension)   ? Hyperlipidemia   ? Osteopenia   ? ? ?Past Surgical History:  ?Procedure Laterality Date  ? CATARACT EXTRACTION W/PHACO Left 10/08/2018  ? Procedure: CATARACT EXTRACTION PHACO AND INTRAOCULAR LENS PLACEMENT ([place]) LEFT;  Surgeon: Birder Robson, MD;  Location: [facility];  Service: Ophthalmology;  Laterality: Left;  ? CATARACT EXTRACTION W/PHACO Right 11/05/2018  ? Procedure: CATARACT EXTRACTION PHACO AND INTRAOCULAR LENS PLACEMENT ([facility]) - right  00:34  15.3%  5.32;  Surgeon: Birder Robson, MD;  Location: [facility];  Service: Ophthalmology;  Laterality: Right;  ? COLONOSCOPY    ? DENTAL SURGERY    ? EYE SURGERY    ? ? ?Family History  ?Problem Relation Age of Onset  ? Diabetes Father   ? Heart disease  Father   ? Diabetes Brother   ? Heart disease Mother   ? Breast cancer Neg Hx   ? ? ?Past Medical History, Surgical History, Social History, Family History, Problem List, Medications, and Allergies have been reviewed and updated if relevant. ? ?Review of Systems: Pertinent  positives are listed above.  Otherwise, a full 14 point review of systems has been done in full and it is negative except where it is noted positive. ? ?Objective:  ? ?BP 100/60   Pulse 75   Temp 98.1 ?F (36.7 ?C) (Oral)   Ht 5' 1.5" (1.562 m)   Wt 149 lb 2 oz (67.6 kg)   SpO2 97%   BMI 27.72 kg/m?  ? ?  11/05/2018  ? 10:33 AM 04/18/2019  ?  9:48 AM 07/05/2020  ? 12:03 PM 04/19/2021  ?  9:56 AM 06/30/2021  ? 10:50 AM  ?Fall Risk  ?Falls in the past year?  1 1  0  ?Was there an injury with Fall?  0 0    ?Fall Risk Category Calculator  1 1    ?Fall Risk Category  Low Low    ?Patient Fall Risk Level Low fall risk Low fall risk Low fall risk Low fall risk   ?Patient at Risk for Falls Due to  History of fall(s);Medication side effect Medication side effect    ?Fall risk Follow up  Falls evaluation completed;Falls prevention discussed Falls evaluation completed;Falls prevention discussed    ? ?Ideal Body Weight:  ?Weight in (lb) to have BMI = 25: 134.2 ?Hearing Screening - Comments:: Wears Bilateral Hearing Aides ?Vision Screening - Comments:: Wears Glasses-[facility] Ex[facility] with [facility] 01/2021 ? ?  06/30/2021  ? 10:50 AM 07/05/2020  ? 12:03 PM 04/18/2019  ?  9:49 AM 04/10/2018  ?  9:16 AM 04/09/2017  ? 10:54 AM  ?Depression screen PHQ 2/9  ?Decreased Interest 0 0 0 0 0  ?Down, Depressed, Hopeless 0 0 1 0 0  ?PHQ - 2 Score 0 0 1 0 0  ?Altered sleeping  0 0    ?Tired, decreased energy  0 0    ?Change in appetite  0 0    ?Feeling bad or failure about yourself   0 0    ?Trouble concentrating  0 0    ?Moving slowly or fidgety/restless  0 0    ?Suicidal thoughts  0 0    ?PHQ-9 Score  0 1    ?Difficult doing work/chores  Not difficult at all Not difficult at all    ? ? ? ?GEN: well developed, well nourished, no acute distress ?Eyes: conjunctiva and lids normal, PERRLA, EOMI ?ENT: TM clear, nares clear, oral exam WNL ?Neck: supple, no lymphadenopathy, no thyromegaly, no JVD ?Pulm: clear to auscultation and percussion,  respiratory effort normal ?CV: regular rate and rhythm, S1-S2, no murmur, rub or gallop, no bruits ?Chest: no scars, masses, no lumps ?BREAST: breast exam declined ?GI: soft, non-tender; no hepatosplenomegaly, masses; active bowel sounds all quadrants ?GU: GU exam declined ?Lymph: no cervical, axillary or inguinal adenopathy ?MSK: gait normal, muscle tone and strength WNL, no joint swelling, effusions, discoloration, crepitus  ?SKIN: clear, good turgor, color WNL, no rashes, lesions, or ulcerations ?Neuro: normal mental status, normal strength, sensation, and motion ?Psych: alert; oriented to person, place and time, normally interactive and not anxious or depressed in appearance. ? ? ?All labs reviewed with patient. ? ?Lipids: ?Lab Results  ?Component Value Date  ? CHOL 143 06/23/2021  ? ?  Lab Results  ?Component Value Date  ? HDL 68.80 06/23/2021  ? ?Lab Results  ?Component Value Date  ? [place] 58 06/23/2021  ? ?Lab Results  ?Component Value Date  ? TRIG 79.0 06/23/2021  ? ?Lab Results  ?Component Value Date  ? CHOLHDL 2 06/23/2021  ? ?CBC: ? ?  Latest Ref Rng & Units 06/23/2021  ?  9:06 AM 07/09/2020  ?  9:48 AM 04/18/2019  ? 10:57 AM  ?CBC  ?WBC 4.0 - 10.5 K/uL 5.4   5.1   5.2    ?Hemoglobin 12.0 - 15.0 g/dL 13.3   13.5   13.4    ?Hematocrit 36.0 - 46.0 % 40.3   40.1   40.8    ?Platelets 150.0 - 400.0 K/uL 217.0   213.0   237.0    ? ? ?Basic Metabolic Panel: ?   ?Component Value Date/Time  ? NA 134 (L) 06/23/2021 0906  ? K 3.8 06/23/2021 0906  ? CL 101 06/23/2021 0906  ? CO2 28 06/23/2021 0906  ? BUN 10 06/23/2021 0906  ? CREATININE 0.86 06/23/2021 0906  ? GLUCOSE 96 06/23/2021 0906  ? CALCIUM 9.5 06/23/2021 0906  ? ? ?  Latest Ref Rng & Units 06/23/2021  ?  9:06 AM 07/09/2020  ?  9:48 AM 04/18/2019  ? 10:57 AM  ?Hepatic Function  ?Total Protein 6.0 - 8.3 g/dL 6.5   6.6   7.1    ?Albumin 3.5 - 5.2 g/dL 4.1   4.1   4.1    ?AST 0 - 37 U/L $Remo'19   19   24    'fuoPI$ ?ALT 0 - 35 U/L $Remo'20   25   22    'YrWqS$ ?Alk Phosphatase 39 - 117 U/L 57   61    68    ?Total Bilirubin 0.2 - 1.2 mg/dL 0.6   0.6   0.5    ?Bilirubin, Direct 0.0 - 0.3 mg/dL 0.1   0.1   0.1    ? ? ?Lab Results  ?Component Value Date  ? HGBA1C 6.0 06/23/2021  ? ?Lab Results  ?Component Value Date  ? TSH 3.

## 2021-07-20 DIAGNOSIS — H353131 Nonexudative age-related macular degeneration, bilateral, early dry stage: Secondary | ICD-10-CM | POA: Diagnosis not present

## 2021-08-04 ENCOUNTER — Other Ambulatory Visit: Payer: Self-pay | Admitting: Family Medicine

## 2021-08-15 ENCOUNTER — Other Ambulatory Visit: Payer: Self-pay | Admitting: *Deleted

## 2021-08-15 MED ORDER — ATORVASTATIN CALCIUM 10 MG PO TABS: ORAL_TABLET | ORAL | 3 refills | Status: DC

## 2021-08-18 DIAGNOSIS — H43812 Vitreous degeneration, left eye: Secondary | ICD-10-CM | POA: Diagnosis not present

## 2021-08-18 DIAGNOSIS — H35372 Puckering of macula, left eye: Secondary | ICD-10-CM | POA: Diagnosis not present

## 2021-08-18 DIAGNOSIS — H43813 Vitreous degeneration, bilateral: Secondary | ICD-10-CM | POA: Diagnosis not present

## 2021-08-18 DIAGNOSIS — H353131 Nonexudative age-related macular degeneration, bilateral, early dry stage: Secondary | ICD-10-CM | POA: Diagnosis not present

## 2021-08-23 ENCOUNTER — Other Ambulatory Visit: Payer: Self-pay | Admitting: Family Medicine

## 2021-09-07 ENCOUNTER — Encounter: Payer: Self-pay | Admitting: Emergency Medicine

## 2021-09-07 ENCOUNTER — Ambulatory Visit
Admission: EM | Admit: 2021-09-07 | Discharge: 2021-09-07 | Disposition: A | Discharge status: Home / Self Care | Payer: Medicare PPO | Attending: Family Medicine | Admitting: Family Medicine

## 2021-09-07 DIAGNOSIS — R2231 Localized swelling, mass and lump, right upper limb: Secondary | ICD-10-CM

## 2021-09-07 DIAGNOSIS — M25541 Pain in joints of right hand: Secondary | ICD-10-CM

## 2021-09-07 MED ORDER — NAPROXEN 375 MG PO TABS
375.0000 mg | ORAL_TABLET | Freq: Every day | ORAL | 0 refills | Status: DC
Start: 2021-09-07 — End: 2023-04-23

## 2021-09-07 NOTE — ED Triage Notes (Signed)
Pt presents with some purple markings on the base of her right hand x 3 days. Pt denies any pain and she has some minor swelling.

## 2021-09-07 NOTE — ED Provider Notes (Signed)
[name]    CSN: [id number] Arrival date & time: 09/07/21  1156      History   Chief Complaint Chief Complaint  Patient presents with  . Hand Discoloration    HPI Rebecca Hamilton is a 86 y.o. female.   HPI  Past Medical History:  Diagnosis Date  . Colon polyps   . COPD (chronic obstructive pulmonary disease) ([facility])   . GERD (gastroesophageal reflux disease)   . HTN (hypertension)   . Hyperlipidemia   . Osteopenia     Patient Active Problem List   Diagnosis Date Noted  . OSTEOPENIA 06/25/2008  . Hyperlipidemia 05/22/2008  . UNSPECIFIED ANEMIA 05/22/2008  . HYPERTENSION 05/22/2008  . COPD (chronic obstructive pulmonary disease) ([facility]) 05/22/2008  . GERD 05/22/2008  . COLONIC POLYPS, HX OF 05/22/2008    Past Surgical History:  Procedure Laterality Date  . CATARACT EXTRACTION W/PHACO Left 10/08/2018   Procedure: CATARACT EXTRACTION PHACO AND INTRAOCULAR LENS PLACEMENT ([facility]) LEFT;  Surgeon: Galen Manila, MD;  Location: [facility];  Service: Ophthalmology;  Laterality: Left;  . CATARACT EXTRACTION W/PHACO Right 11/05/2018   Procedure: CATARACT EXTRACTION PHACO AND INTRAOCULAR LENS PLACEMENT ([facility]) - right  00:34  15.3%  5.32;  Surgeon: Galen Manila, MD;  Location: [facility];  Service: Ophthalmology;  Laterality: Right;  . COLONOSCOPY    . DENTAL SURGERY    . EYE SURGERY      OB History   No obstetric history on file.      Home Medications    Prior to Admission medications   Medication Sig Start Date End Date Taking? Authorizing Provider  naproxen (NAPROSYN) 375 MG tablet Take 1 tablet (375 mg total) by mouth daily for 7 days. 09/07/21 09/14/21 Yes Bing Neighbors, FNP  amLODipine (NORVASC) 5 MG tablet TAKE 1 TABLET BY MOUTH ONCE DAILY 08/04/21   Copland, Karleen Hampshire, MD  atorvastatin (LIPITOR) 10 MG tablet TAKE 1 TABLET BY MOUTH ONCE DAILY (NEED OFFICE VISIT) 08/15/21   Copland, Karleen Hampshire, MD  Calcium-Vitamin D-Vitamin K  (VIACTIV CALCIUM PLUS D) 650-12.5-40 MG-MCG-MCG CHEW Chew 2 each by mouth daily.    [provider]  Cholecalciferol (VITAMIN D) 2000 UNITS CAPS Take by mouth 2 (two) times daily.    [provider]  dorzolamide (TRUSOPT) 2 % ophthalmic solution Place 1 drop into the right eye 2 (two) times daily.    [provider]  Glucosamine-Chondroitin (GLUCOSAMINE CHONDR COMPLEX PO) Take 1 tablet by mouth daily.    [provider]  hydrochlorothiazide (MICROZIDE) 12.5 MG capsule TAKE 1 CAPSULE BY MOUTH ONCE DAILY 08/23/21   Copland, Karleen Hampshire, MD  latanoprost (XALATAN) 0.005 % ophthalmic solution  11/16/15   [provider]  losartan (COZAAR) 50 MG tablet TAKE 1 TABLET BY MOUTH ONCE DAILY 05/31/21   Copland, Karleen Hampshire, MD  Multiple Vitamins-Minerals (ICAPS AREDS 2) CAPS Take 1 each by mouth 2 (two) times daily.    [provider]  SPIRIVA HANDIHALER 18 MCG inhalation capsule INHALE CONTENTS OF ONE CAPSULES ONCE DAILY 09/06/20   Copland, Karleen Hampshire, MD    Family History Family History  Problem Relation Age of Onset  . Diabetes Father   . Heart disease Father   . Diabetes Brother   . Heart disease Mother   . Breast cancer Neg Hx     Social History Social History   Tobacco Use  . Smoking status: Former    Types: Cigarettes    Quit date: 1988    Years  since quitting: 35.5  . Smokeless tobacco: Never  . Tobacco comments:    quit 1988  Vaping Use  . Vaping Use: Never used  Substance Use Topics  . Alcohol use: Yes    Alcohol/week: 2.0 standard drinks of alcohol    Types: 1 Glasses of wine, 1 Shots of liquor per week    Comment: daily  . Drug use: No     Allergies   Codeine   Review of Systems Review of Systems   Physical Exam Triage Vital Signs ED Triage Vitals  Enc Vitals Group     BP 09/07/21 1235 136/75     Pulse Rate 09/07/21 1235 71     Resp 09/07/21 1235 16     Temp 09/07/21 1235 97.9 F (36.6 C)     Temp Source 09/07/21 1235  Oral     SpO2 09/07/21 1235 97 %     Weight --      Height --      Head Circumference --      Peak Flow --      Pain Score 09/07/21 1241 0     Pain Loc --      Pain Edu? --      Excl. in GC? --    No data found.  Updated Vital Signs BP 136/75 (BP Location: Left Arm)   Pulse 71   Temp 97.9 F (36.6 C) (Oral)   Resp 16   SpO2 97%   Visual Acuity Right Eye Distance:   Left Eye Distance:   Bilateral Distance:    Right Eye Near:   Left Eye Near:    Bilateral Near:     Physical Exam   UC Treatments / Results  Labs (all labs ordered are listed, but only abnormal results are displayed) Labs Reviewed - No data to display  EKG   Radiology No results found.  Procedures Procedures (including critical care time)  Medications Ordered in UC Medications - No data to display  Initial Impression / Assessment and Plan / UC Course  I have reviewed the triage vital signs and the nursing notes.  Pertinent labs & imaging results that were available during my care of the patient were reviewed by me and considered in my medical decision making (see chart for details).     *** Final Clinical Impressions(s) / UC Diagnoses   Final diagnoses:  Localized swelling of right thumb  Pain in thumb joint with movement, right     Discharge Instructions      Take Naproxen once daily for 7 days for hand pain and swelling.  You may also take over the counter extra strength arthritis strength tylenol for swelling and hand pain.  If symptoms worsen, follow-up with Primary Care Provider.   ED Prescriptions     Medication Sig Dispense Auth. Provider   naproxen (NAPROSYN) 375 MG tablet Take 1 tablet (375 mg total) by mouth daily for 7 days. 7 tablet Bing Neighbors, FNP      PDMP not reviewed this encounter.

## 2021-09-07 NOTE — Discharge Instructions (Addendum)
Take Naproxen once daily for 7 days for hand pain and swelling.  You may also take over the counter extra strength arthritis strength tylenol for swelling and hand pain.  If symptoms worsen, follow-up with Primary Care Provider.

## 2021-09-14 ENCOUNTER — Other Ambulatory Visit: Payer: Self-pay | Admitting: Family Medicine

## 2021-11-26 ENCOUNTER — Other Ambulatory Visit: Payer: Self-pay | Admitting: Family Medicine

## 2022-02-16 DIAGNOSIS — H903 Sensorineural hearing loss, bilateral: Secondary | ICD-10-CM | POA: Diagnosis not present

## 2022-03-10 DIAGNOSIS — H353131 Nonexudative age-related macular degeneration, bilateral, early dry stage: Secondary | ICD-10-CM | POA: Diagnosis not present

## 2022-04-03 ENCOUNTER — Other Ambulatory Visit: Payer: Self-pay | Admitting: Family Medicine

## 2022-04-03 DIAGNOSIS — Z1231 Encounter for screening mammogram for malignant neoplasm of breast: Secondary | ICD-10-CM

## 2022-05-01 ENCOUNTER — Ambulatory Visit
Admission: RE | Admit: 2022-05-01 | Discharge: 2022-05-01 | Disposition: A | Discharge status: Home / Self Care | Payer: Medicare PPO | Source: Ambulatory Visit | Attending: Family Medicine | Admitting: Family Medicine

## 2022-05-01 DIAGNOSIS — Z1231 Encounter for screening mammogram for malignant neoplasm of breast: Secondary | ICD-10-CM | POA: Diagnosis not present

## 2022-06-02 ENCOUNTER — Telehealth: Payer: Self-pay | Admitting: Family Medicine

## 2022-06-02 NOTE — Telephone Encounter (Signed)
Please call and schedule Medicare Wellness with Nurse and CPE with fasting labs prior with Dr. Lorelei Pont for after 04/20/204.

## 2022-06-02 NOTE — Telephone Encounter (Signed)
Patient has been scheduled

## 2022-06-03 ENCOUNTER — Other Ambulatory Visit: Payer: Self-pay | Admitting: Family Medicine

## 2022-06-05 ENCOUNTER — Other Ambulatory Visit: Payer: Self-pay | Admitting: Family Medicine

## 2022-06-25 ENCOUNTER — Other Ambulatory Visit: Payer: Self-pay | Admitting: Family Medicine

## 2022-06-25 DIAGNOSIS — E559 Vitamin D deficiency, unspecified: Secondary | ICD-10-CM

## 2022-06-25 DIAGNOSIS — E782 Mixed hyperlipidemia: Secondary | ICD-10-CM

## 2022-06-25 DIAGNOSIS — R7303 Prediabetes: Secondary | ICD-10-CM

## 2022-06-25 DIAGNOSIS — Z79899 Other long term (current) drug therapy: Secondary | ICD-10-CM

## 2022-06-28 ENCOUNTER — Other Ambulatory Visit (INDEPENDENT_AMBULATORY_CARE_PROVIDER_SITE_OTHER): Payer: Medicare PPO

## 2022-06-28 DIAGNOSIS — E559 Vitamin D deficiency, unspecified: Secondary | ICD-10-CM | POA: Diagnosis not present

## 2022-06-28 DIAGNOSIS — Z79899 Other long term (current) drug therapy: Secondary | ICD-10-CM | POA: Diagnosis not present

## 2022-06-28 DIAGNOSIS — R7303 Prediabetes: Secondary | ICD-10-CM

## 2022-06-28 DIAGNOSIS — E782 Mixed hyperlipidemia: Secondary | ICD-10-CM

## 2022-06-28 LAB — CBC WITH DIFFERENTIAL/PLATELET
Basophils Absolute: 0 10*3/uL (ref 0.0–0.1)
Basophils Relative: 0.8 % (ref 0.0–3.0)
Eosinophils Absolute: 0.2 10*3/uL (ref 0.0–0.7)
Eosinophils Relative: 3.5 % (ref 0.0–5.0)
HCT: 40.3 % (ref 36.0–46.0)
Hemoglobin: 13.7 g/dL (ref 12.0–15.0)
Lymphocytes Relative: 26.6 % (ref 12.0–46.0)
Lymphs Abs: 1.2 10*3/uL (ref 0.7–4.0)
MCHC: 33.9 g/dL (ref 30.0–36.0)
MCV: 90.8 fl (ref 78.0–100.0)
Monocytes Absolute: 0.4 10*3/uL (ref 0.1–1.0)
Monocytes Relative: 9.6 % (ref 3.0–12.0)
Neutro Abs: 2.8 10*3/uL (ref 1.4–7.7)
Neutrophils Relative %: 59.5 % (ref 43.0–77.0)
Platelets: 229 10*3/uL (ref 150.0–400.0)
RBC: 4.44 Mil/uL (ref 3.87–5.11)
RDW: 13.9 % (ref 11.5–15.5)
WBC: 4.6 10*3/uL (ref 4.0–10.5)

## 2022-06-28 LAB — BASIC METABOLIC PANEL
BUN: 16 mg/dL (ref 6–23)
CO2: 29 mEq/L (ref 19–32)
Calcium: 9.6 mg/dL (ref 8.4–10.5)
Chloride: 100 mEq/L (ref 96–112)
Creatinine, Ser: 0.91 mg/dL (ref 0.40–1.20)
GFR: 57.77 mL/min — ABNORMAL LOW (ref >60.00)
Glucose, Bld: 99 mg/dL (ref 70–99)
Potassium: 4 mEq/L (ref 3.5–5.1)
Sodium: 136 mEq/L (ref 135–145)

## 2022-06-28 LAB — LIPID PANEL
Cholesterol: 151 mg/dL (ref 0–200)
HDL: 62.4 mg/dL (ref >39.00)
LDL Cholesterol: 75 mg/dL (ref 0–99)
NonHDL: 89.07
Total CHOL/HDL Ratio: 2
Triglycerides: 69 mg/dL (ref 0.0–149.0)
VLDL: 13.8 mg/dL (ref 0.0–40.0)

## 2022-06-28 LAB — HEPATIC FUNCTION PANEL
ALT: 14 U/L (ref 0–35)
AST: 17 U/L (ref 0–37)
Albumin: 4.1 g/dL (ref 3.5–5.2)
Alkaline Phosphatase: 50 U/L (ref 39–117)
Bilirubin, Direct: 0.1 mg/dL (ref 0.0–0.3)
Total Bilirubin: 0.6 mg/dL (ref 0.2–1.2)
Total Protein: 6.5 g/dL (ref 6.0–8.3)

## 2022-06-28 LAB — HEMOGLOBIN A1C: Hgb A1c MFr Bld: 5.9 % (ref 4.6–6.5)

## 2022-06-28 LAB — VITAMIN D 25 HYDROXY (VIT D DEFICIENCY, FRACTURES): VITD: 73.63 ng/mL (ref 30.00–100.00)

## 2022-06-28 LAB — TSH: TSH: 2.96 u[IU]/mL (ref 0.35–5.50)

## 2022-07-05 ENCOUNTER — Encounter: Payer: Self-pay | Admitting: Family Medicine

## 2022-07-05 ENCOUNTER — Ambulatory Visit (INDEPENDENT_AMBULATORY_CARE_PROVIDER_SITE_OTHER): Payer: Medicare PPO | Admitting: Family Medicine

## 2022-07-05 VITALS — BP 100/60 | HR 74 | Temp 97.9°F | Ht 61.5 in | Wt 147.5 lb

## 2022-07-05 DIAGNOSIS — Z0000 Encounter for general adult medical examination without abnormal findings: Secondary | ICD-10-CM

## 2022-07-05 NOTE — Patient Instructions (Signed)
Clotrimazole use twice a day until clear

## 2022-07-05 NOTE — Progress Notes (Signed)
Rebecca Zervas T. Alizzon Brents, MD, [name], [address]  Phone: [phone]  FAX: [phone]  Rebecca Hamilton - 87 y.o. female  MRN [id number]  Date of Birth: August 19, 1935  Date: 07/05/2022  PCP: Hannah Beat, MD  Referral: Hannah Beat, MD  Chief Complaint  Patient presents with   Medicare Wellness   Patient Care Team: Hannah Beat, MD as PCP - General Alora Gorey, Karleen Hampshire, MD Subjective:   Rebecca Hamilton is a 87 y.o. pleasant patient who presents for a medicare wellness examination:  Health Maintenance Summary Reviewed and updated, unless pt declines services.  Tobacco History Reviewed. Non-smoker Alcohol: No concerns, no excessive use - 1 oz every day Exercise Habits: Some activity, rec at least 30 mins 5 times a week STD concerns: none Drug Use: None Birth control method: n/a Menses regular: n/a Lumps or breast concerns: no Breast Cancer Family History: no  Covid booster RSV - got at [facility] or maybe at [facility]  COPD is stable, doing spiriva daily  Health Maintenance  Topic Date Due   COVID-19 Vaccine (5 - 2023-24 season) 11/11/2021   Medicare Annual Wellness (AWV)  07/01/2022   INFLUENZA VACCINE  10/12/2022   MAMMOGRAM  05/02/2023   DTaP/Tdap/Td (4 - Td or Tdap) 08/04/2030   Pneumonia Vaccine 90+ Years old  Completed   DEXA SCAN  Completed   Zoster Vaccines- Shingrix  Completed   HPV VACCINES  Aged Out   Immunization History  Administered Date(s) Administered   Fluad Quad(high Dose 65+) 01/07/2019   Hepatitis A 06/26/1995, 12/26/1995   Hepatitis B 12/01/2005, 01/01/2006, 07/06/2006   Influenza Split 12/29/2011   Influenza Whole 03/14/2007   Influenza,inj,Quad PF,6+ Mos 12/03/2013, 01/11/2015, 01/18/2016, 01/04/2017, 12/24/2017   Influenza-Unspecified 12/11/2012   Moderna Covid-19 Vaccine Bivalent Booster 6yrs & up 12/10/2020   PFIZER(Purple  Top)SARS-COV-2 Vaccination 03/19/2019, 04/08/2019, 12/12/2019   Pneumococcal Conjugate-13 12/03/2013   Pneumococcal Polysaccharide-23 03/13/2005, 12/01/2005   Td 03/13/2005, 12/01/2005   Tdap 08/03/2020   Zoster Recombinat (Shingrix) 12/27/2017, 03/25/2018   Zoster, Live 07/06/2006    Patient Active Problem List   Diagnosis Date Noted   OSTEOPENIA 06/25/2008   Hyperlipidemia 05/22/2008   UNSPECIFIED ANEMIA 05/22/2008   HYPERTENSION 05/22/2008   COPD (chronic obstructive pulmonary disease) (HCC) 05/22/2008   GERD 05/22/2008   COLONIC POLYPS, HX OF 05/22/2008    Past Medical History:  Diagnosis Date   Colon polyps    COPD (chronic obstructive pulmonary disease)    GERD (gastroesophageal reflux disease)    HTN (hypertension)    Hyperlipidemia    Osteopenia     Past Surgical History:  Procedure Laterality Date   CATARACT EXTRACTION W/PHACO Left 10/08/2018   Procedure: CATARACT EXTRACTION PHACO AND INTRAOCULAR LENS PLACEMENT (IOC) LEFT;  Surgeon: Galen Manila, MD;  Location: [facility];  Service: Ophthalmology;  Laterality: Left;   CATARACT EXTRACTION W/PHACO Right 11/05/2018   Procedure: CATARACT EXTRACTION PHACO AND INTRAOCULAR LENS PLACEMENT (IOC) - right  00:34  15.3%  5.32;  Surgeon: Galen Manila, MD;  Location: [facility];  Service: Ophthalmology;  Laterality: Right;   COLONOSCOPY     DENTAL SURGERY     EYE SURGERY      Family History  Problem Relation Age of Onset   Diabetes Father    Heart disease Father    Diabetes Brother    Heart disease Mother    Breast cancer Neg Hx  Social History   Social History Narrative   Regular exercise--no    Past Medical History, Surgical History, Social History, Family History, Problem List, Medications, and Allergies have been reviewed and updated if relevant.  Review of Systems: Pertinent positives are listed above.  Otherwise, a full 14 point review of systems has been done in full and it is  negative except where it is noted positive.  Objective:   BP 100/60   Pulse 74   Temp 97.9 F (36.6 C) (Temporal)   Ht 5' 1.5" (1.562 m)   Wt 147 lb 8 oz (66.9 kg)   SpO2 99%   BMI 27.42 kg/m     07/05/2020   12:03 PM 04/19/2021    9:56 AM 06/30/2021   10:50 AM 09/07/2021   12:42 PM 07/05/2022    9:08 AM  Fall Risk  Falls in the past year? 1  0  0  Was there an injury with Fall? 0    0  Fall Risk Category Calculator 1    0  Fall Risk Category (Retired) Low      (RETIRED) Patient Fall Risk Level Low fall risk Low fall risk  Low fall risk   Patient at Risk for Falls Due to Medication side effect    No Fall Risks  Fall risk Follow up Falls evaluation completed;Falls prevention discussed    Falls evaluation completed   Ideal Body Weight: Weight in (lb) to have BMI = 25: 134.2 Hearing Screening - Comments:: Wears Bilateral Hearing Aides Vision Screening - Comments:: Wears Glasses-[facility] Exam at [facility] Dr. Druscilla Brownie    07/05/2022    9:08 AM 06/30/2021   10:50 AM 07/05/2020   12:03 PM 04/18/2019    9:49 AM 04/10/2018    9:16 AM  Depression screen PHQ 2/9  Decreased Interest 0 0 0 0 0  Down, Depressed, Hopeless 0 0 0 1 0  PHQ - 2 Score 0 0 0 1 0  Altered sleeping   0 0   Tired, decreased energy   0 0   Change in appetite   0 0   Feeling bad or failure about yourself    0 0   Trouble concentrating   0 0   Moving slowly or fidgety/restless   0 0   Suicidal thoughts   0 0   PHQ-9 Score   0 1   Difficult doing work/chores   Not difficult at all Not difficult at all      GEN: well developed, well nourished, no acute distress Eyes: conjunctiva and lids normal, PERRLA, EOMI ENT: TM clear, nares clear, oral exam WNL Neck: supple, no lymphadenopathy, no thyromegaly, no JVD Pulm: clear to auscultation and percussion, respiratory effort normal CV: regular rate and rhythm, S1-S2, no murmur, rub or gallop, no bruits Chest: no scars, masses, no lumps BREAST: breast exam declined GI:  soft, non-tender; no hepatosplenomegaly, masses; active bowel sounds all quadrants GU: GU exam declined Lymph: no cervical, axillary or inguinal adenopathy MSK: gait normal, muscle tone and strength WNL, no joint swelling, effusions, discoloration, crepitus  SKIN: clear, good turgor, color WNL, no rashes, lesions, or ulcerations Neuro: normal mental status, normal strength, sensation, and motion Psych: alert; oriented to person, place and time, normally interactive and not anxious or depressed in appearance.   All labs reviewed with patient.  Lipids: Lab Results  Component Value Date   CHOL 151 06/28/2022   Lab Results  Component Value Date   HDL 62.40 06/28/2022  Lab Results  Component Value Date   LDLCALC 75 06/28/2022   Lab Results  Component Value Date   TRIG 69.0 06/28/2022   Lab Results  Component Value Date   CHOLHDL 2 06/28/2022   CBC:    Latest Ref Rng & Units 06/28/2022    8:47 AM 06/23/2021    9:06 AM 07/09/2020    9:48 AM  CBC  WBC 4.0 - 10.5 K/uL 4.6  5.4  5.1   Hemoglobin 12.0 - 15.0 g/dL 40.9  81.1  91.4   Hematocrit 36.0 - 46.0 % 40.3  40.3  40.1   Platelets 150.0 - 400.0 K/uL 229.0  217.0  213.0     Basic Metabolic Panel:    Component Value Date/Time   NA 136 06/28/2022 0847   K 4.0 06/28/2022 0847   CL 100 06/28/2022 0847   CO2 29 06/28/2022 0847   BUN 16 06/28/2022 0847   CREATININE 0.91 06/28/2022 0847   GLUCOSE 99 06/28/2022 0847   CALCIUM 9.6 06/28/2022 0847      Latest Ref Rng & Units 06/28/2022    8:47 AM 06/23/2021    9:06 AM 07/09/2020    9:48 AM  Hepatic Function  Total Protein 6.0 - 8.3 g/dL 6.5  6.5  6.6   Albumin 3.5 - 5.2 g/dL 4.1  4.1  4.1   AST 0 - 37 U/L 17  19  19    ALT 0 - 35 U/L 14  20  25    Alk Phosphatase 39 - 117 U/L 50  57  61   Total Bilirubin 0.2 - 1.2 mg/dL 0.6  0.6  0.6   Bilirubin, Direct 0.0 - 0.3 mg/dL 0.1  0.1  0.1     Lab Results  Component Value Date   HGBA1C 5.9 06/28/2022   Lab Results   Component Value Date   TSH 2.96 06/28/2022    No results found.  Assessment and Plan:     ICD-10-CM   1. Healthcare maintenance  Z00.00      Globally, she is doing well at age 5.  She has no significant complaints right now.  I do not think she needs to change anything and continue to eat well and exercise.  Health Maintenance Exam: The patient's preventative maintenance and recommended screening tests for an annual wellness exam were reviewed in full today. Brought up to date unless services declined.  Counselled on the importance of diet, exercise, and its role in overall health and mortality. The patient's FH and SH was reviewed, including their home life, tobacco status, and drug and alcohol status.  Follow-up in 1 year for physical exam or additional follow-up below.  I have personally reviewed the Medicare Annual Wellness questionnaire and have noted 1. The patient's medical and social history 2. Their use of alcohol, tobacco or illicit drugs 3. Their current medications and supplements 4. The patient's functional ability including ADL's, fall risks, home safety risks and hearing or visual             impairment. 5. Diet and physical activities 6. Evidence for depression or mood disorders 7. Reviewed Updated provider list, see scanned forms and [facility] Snapshot.  8. Reviewed whether or not the patient has HCPOA or living will, and discussed what this means with the patient.  Recommended she bring in a copy for his chart in [facility].  The patients weight, height, BMI and visual acuity have been recorded in the chart I have made referrals, counseling and provided  education to the patient based review of the above and I have provided the pt with a written personalized care plan for preventive services.  I have provided the patient with a copy of your personalized plan for preventive services. Instructed to take the time to review along with their updated medication  list.  Disposition: No follow-ups on file.  No future appointments.   No orders of the defined types were placed in this encounter.  There are no discontinued medications. No orders of the defined types were placed in this encounter.   Signed,  Elpidio Galea. Adellyn Capek, MD   Allergies as of 07/05/2022       Reactions   Codeine Nausea Only        Medication List        Accurate as of July 05, 2022  4:42 PM. If you have any questions, ask your nurse or doctor.          amLODipine 5 MG tablet Commonly known as: NORVASC TAKE ONE TABLET BY MOUTH ONCE DAILY   atorvastatin 10 MG tablet Commonly known as: LIPITOR TAKE ONE TABLET BY MOUTH ONCE DAILY (NEED OFFICE VISIT)   dorzolamide 2 % ophthalmic solution Commonly known as: TRUSOPT Place 1 drop into the right eye 2 (two) times daily.   GLUCOSAMINE CHONDR COMPLEX PO Take 1 tablet by mouth daily.   hydrochlorothiazide 12.5 MG capsule Commonly known as: MICROZIDE TAKE ONE CAPSULE BY MOUTH ONCE DAILY   ICaps Areds 2 Caps Take 1 each by mouth 2 (two) times daily.   latanoprost 0.005 % ophthalmic solution Commonly known as: XALATAN   losartan 50 MG tablet Commonly known as: COZAAR TAKE ONE TABLET BY MOUTH ONCE DAILY   naproxen 375 MG tablet Commonly known as: NAPROSYN Take 1 tablet (375 mg total) by mouth daily for 7 days.   Spiriva HandiHaler 18 MCG inhalation capsule Generic drug: tiotropium INHALE CONTENTS OF ONE CAPSULES ONCE DAILY   Viactiv Calcium Plus D 650-12.5-40 MG-MCG-MCG Chew Generic drug: Calcium-Vitamin D-Vitamin K Chew 2 each by mouth daily.   Vitamin D 50 MCG (2000 UT) Caps Take by mouth 2 (two) times daily.

## 2022-08-29 ENCOUNTER — Other Ambulatory Visit: Payer: Self-pay | Admitting: Family Medicine

## 2022-09-08 DIAGNOSIS — H40003 Preglaucoma, unspecified, bilateral: Secondary | ICD-10-CM | POA: Diagnosis not present

## 2022-09-08 DIAGNOSIS — H35379 Puckering of macula, unspecified eye: Secondary | ICD-10-CM | POA: Diagnosis not present

## 2022-09-08 DIAGNOSIS — H353131 Nonexudative age-related macular degeneration, bilateral, early dry stage: Secondary | ICD-10-CM | POA: Diagnosis not present

## 2022-09-30 ENCOUNTER — Other Ambulatory Visit: Payer: Self-pay | Admitting: Family Medicine

## 2022-10-28 ENCOUNTER — Other Ambulatory Visit: Payer: Self-pay | Admitting: Family Medicine

## 2022-11-03 ENCOUNTER — Other Ambulatory Visit: Payer: Self-pay | Admitting: Family Medicine

## 2022-11-23 ENCOUNTER — Other Ambulatory Visit: Payer: Self-pay | Admitting: Family Medicine

## 2023-03-08 DIAGNOSIS — H26492 Other secondary cataract, left eye: Secondary | ICD-10-CM | POA: Diagnosis not present

## 2023-03-08 DIAGNOSIS — H35379 Puckering of macula, unspecified eye: Secondary | ICD-10-CM | POA: Diagnosis not present

## 2023-03-08 DIAGNOSIS — H353131 Nonexudative age-related macular degeneration, bilateral, early dry stage: Secondary | ICD-10-CM | POA: Diagnosis not present

## 2023-03-08 DIAGNOSIS — H43813 Vitreous degeneration, bilateral: Secondary | ICD-10-CM | POA: Diagnosis not present

## 2023-03-29 ENCOUNTER — Other Ambulatory Visit: Payer: Self-pay | Admitting: Family Medicine

## 2023-03-29 DIAGNOSIS — Z1231 Encounter for screening mammogram for malignant neoplasm of breast: Secondary | ICD-10-CM

## 2023-04-09 ENCOUNTER — Other Ambulatory Visit: Payer: Self-pay | Admitting: Family Medicine

## 2023-04-09 NOTE — Telephone Encounter (Signed)
Last office visit 07/05/2022 for MWV.  Not on current medication list.  Refill?

## 2023-04-11 DIAGNOSIS — H26491 Other secondary cataract, right eye: Secondary | ICD-10-CM | POA: Diagnosis not present

## 2023-04-20 ENCOUNTER — Other Ambulatory Visit: Payer: Self-pay | Admitting: Family Medicine

## 2023-04-20 NOTE — Telephone Encounter (Signed)
 Please schedule Medicare Wellness with Rebecca Hamilton and CPE with fasting labs prior for Dr. Geralyn Knee after 07/05/2023

## 2023-04-21 ENCOUNTER — Other Ambulatory Visit: Payer: Self-pay | Admitting: Family Medicine

## 2023-04-23 ENCOUNTER — Ambulatory Visit: Payer: Medicare PPO | Admitting: Family Medicine

## 2023-04-23 ENCOUNTER — Ambulatory Visit (INDEPENDENT_AMBULATORY_CARE_PROVIDER_SITE_OTHER)
Admission: RE | Admit: 2023-04-23 | Discharge: 2023-04-23 | Disposition: A | Discharge status: Home / Self Care | Payer: Medicare PPO | Source: Ambulatory Visit | Attending: Family Medicine | Admitting: Family Medicine

## 2023-04-23 ENCOUNTER — Encounter: Payer: Self-pay | Admitting: Family Medicine

## 2023-04-23 VITALS — BP 122/70 | HR 69 | Temp 98.2°F | Ht 61.5 in | Wt 150.1 lb

## 2023-04-23 DIAGNOSIS — L209 Atopic dermatitis, unspecified: Secondary | ICD-10-CM | POA: Diagnosis not present

## 2023-04-23 DIAGNOSIS — J208 Acute bronchitis due to other specified organisms: Secondary | ICD-10-CM

## 2023-04-23 DIAGNOSIS — I700 Atherosclerosis of aorta: Secondary | ICD-10-CM | POA: Diagnosis not present

## 2023-04-23 DIAGNOSIS — R052 Subacute cough: Secondary | ICD-10-CM

## 2023-04-23 DIAGNOSIS — R059 Cough, unspecified: Secondary | ICD-10-CM | POA: Diagnosis not present

## 2023-04-23 MED ORDER — BETAMETHASONE VALERATE 0.1 % EX OINT: 1.0000 | TOPICAL_OINTMENT | Freq: Two times a day (BID) | CUTANEOUS | 0 refills | Status: DC

## 2023-04-23 MED ORDER — DOXYCYCLINE HYCLATE 100 MG PO TABS: 100.0000 mg | ORAL_TABLET | Freq: Two times a day (BID) | ORAL | 0 refills | Status: DC

## 2023-04-23 NOTE — Progress Notes (Signed)
 [name], MD, CAQ Sports Medicine [facility] at [facility] [address] [place], [address]  Phone: [phone]  FAX: [phone]  Rebecca Hamilton - 88 y.o. female  MRN [id number]  Date of Birth: Apr 18, 1935  Date: 04/23/2023  PCP: Scherrie Curt, MD  Referral: Scherrie Curt, MD  Chief Complaint  Patient presents with   Bronchitis    X 1 month but now phlegm is changing to yellow   Subjective:   Rebecca Hamilton is a 88 y.o. very pleasant female patient with Body mass index is 27.91 kg/m. who presents with the following:  Started out around allergies in the fall.  Came and went off and on.  At baseline, she does have COPD, but she is very compliant taking her Spiriva .  About a month ago, sounded like some bronchitis.  Tried some OTC meds.  Also send her in some opioid cough medicine, which helps some at nighttime.  On Friday, phlegm started to change and get yellow She does not have really any significant headache, sore throat, earache. She does not feel as if she is having any shortness of breath.  No nausea, vomiting, diarrhea No fever.  Head feels full  Prod cough - ABX  She also has had some scattered scaly patches on the arms and legs  Review of Systems is noted in the HPI, as appropriate  Objective:   BP 122/70 (BP Location: Left Arm, Patient Position: Sitting, Cuff Size: Normal)   Pulse 69   Temp 98.2 F (36.8 C) (Temporal)   Ht 5' 1.5" (1.562 m)   Wt 150 lb 2 oz (68.1 kg)   SpO2 96%   BMI 27.91 kg/m    Gen: WDWN, NAD. Globally Non-toxic HEENT: Throat clear, w/o exudate, R TM clear, L TM - good landmarks, No fluid present. rhinnorhea.  MMM Frontal sinuses: NT Max sinuses: NT NECK: Anterior cervical  LAD is absent CV: RRR, No M/G/R, cap refill <2 sec PULM: Breathing comfortably in no respiratory distress. no wheezing, crackles, rhonchi   Visualized on the legs some small relatively flat scaly areas that  are mildly pink in coloration  Laboratory and Imaging Data:  Assessment and Plan:     ICD-10-CM   1. Subacute cough  R05.2 DG Chest 2 View    2. Atopic dermatitis, unspecified type  L20.9      I am going to go ahead and add some antibiotics for ongoing cough with its productive change to yellowish sputum, concerning for bronchitis. -She does have underlying COPD, but does not appear to be exacerbated with no shortness of breath and normal oxygenation.  Has the appearance of some eczema, I am going to increase the amount of steroid and give her some betamethasone .  Medication Management during today's office visit: Meds ordered this encounter  Medications   doxycycline  (VIBRA -TABS) 100 MG tablet    Sig: Take 1 tablet (100 mg total) by mouth 2 (two) times daily.    Dispense:  20 tablet    Refill:  0   betamethasone  valerate ointment (VALISONE ) 0.1 %    Sig: Apply 1 Application topically 2 (two) times daily.    Dispense:  30 g    Refill:  0   Medications Discontinued During This Encounter  Medication Reason   Glucosamine-Chondroitin (GLUCOSAMINE CHONDR COMPLEX PO) Patient Preference   naproxen  (NAPROSYN ) 375 MG tablet Completed Course    Orders placed today for conditions managed today: Orders Placed This Encounter  Procedures   DG Chest 2 View    Disposition: No follow-ups on file.  Dr[employer] One speech-to-text software was used for transcription in this dictation.  Possible transcriptional errors can occur using Animal nutritionist.   Signed,  Ranny Bye. Treshaun Carrico, MD   Outpatient Encounter Medications as of 04/23/2023  Medication Sig   amLODipine  (NORVASC ) 5 MG tablet TAKE ONE TABLET BY MOUTH ONCE DAILY   atorvastatin  (LIPITOR) 10 MG tablet TAKE ONE TABLET BY MOUTH ONCE DAILY (NEED OFFICE VISIT)   betamethasone  valerate ointment (VALISONE ) 0.1 % Apply 1 Application topically 2 (two) times daily.   Calcium -Vitamin D -Vitamin K (VIACTIV CALCIUM  PLUS D) 650-12.5-40  MG-MCG-MCG CHEW Chew 2 each by mouth daily.   chlorpheniramine-HYDROcodone  (TUSSIONEX) 10-8 MG/5ML TAKE FIVE MLS BY MOUTH EVERY 12 HOURS AS NEEDED FOR COUGH   Cholecalciferol (VITAMIN D ) 2000 UNITS CAPS Take by mouth 2 (two) times daily.   dorzolamide (TRUSOPT) 2 % ophthalmic solution Place 1 drop into the right eye 2 (two) times daily.   doxycycline  (VIBRA -TABS) 100 MG tablet Take 1 tablet (100 mg total) by mouth 2 (two) times daily.   hydrochlorothiazide  (MICROZIDE ) 12.5 MG capsule TAKE ONE CAPSULE BY MOUTH ONCE DAILY   latanoprost (XALATAN) 0.005 % ophthalmic solution Place 1 drop into both eyes daily.   losartan  (COZAAR ) 50 MG tablet TAKE ONE TABLET BY MOUTH ONCE DAILY   Multiple Vitamins-Minerals (ICAPS AREDS 2) CAPS Take 1 each by mouth 2 (two) times daily.   SPIRIVA  HANDIHALER 18 MCG inhalation capsule INHALE CONTENTS OF ONE CAPSULES ONCE DAILY   [DISCONTINUED] Glucosamine-Chondroitin (GLUCOSAMINE CHONDR COMPLEX PO) Take 1 tablet by mouth daily.   [DISCONTINUED] naproxen  (NAPROSYN ) 375 MG tablet Take 1 tablet (375 mg total) by mouth daily for 7 days.   No facility-administered encounter medications on file as of 04/23/2023.

## 2023-04-23 NOTE — Telephone Encounter (Signed)
 Patient has been scheduled

## 2023-05-04 ENCOUNTER — Telehealth: Payer: Self-pay

## 2023-05-04 DIAGNOSIS — Z0100 Encounter for examination of eyes and vision without abnormal findings: Secondary | ICD-10-CM | POA: Diagnosis not present

## 2023-05-04 MED ORDER — AZITHROMYCIN 250 MG PO TABS: ORAL_TABLET | ORAL | 0 refills | Status: AC

## 2023-05-04 NOTE — Telephone Encounter (Signed)
Z-Pak prescription sent to [facility] as instructed by Dr. Patsy Lager.  Rebecca Hamilton notified of this via telephone.  Patient states understanding.

## 2023-05-04 NOTE — Addendum Note (Signed)
Addended by: Damita Lack on: 05/04/2023 02:54 PM   Modules accepted: Orders

## 2023-05-04 NOTE — Telephone Encounter (Signed)
Copied from CRM [id number]. Topic: Clinical - Medication Question >> May 04, 2023 10:28 AM [place] E wrote: Reason for CRM: Patient called in stating that she was prescribed doxycycline (VIBRA-TABS) 100 MG tablet at her last visit and it has been making her sick. The prescription came with 20 pills, and she stopped taking it after the 12th pill, because she was having some stomach issues and threw up. The last dose she took was on 2/15. Patient is wondering if there is another medication she can take that won't make her sick.

## 2023-05-05 ENCOUNTER — Other Ambulatory Visit: Payer: Self-pay | Admitting: Family Medicine

## 2023-05-06 ENCOUNTER — Encounter: Payer: Self-pay | Admitting: Family Medicine

## 2023-05-10 ENCOUNTER — Telehealth: Payer: Self-pay | Admitting: Family Medicine

## 2023-05-10 ENCOUNTER — Ambulatory Visit: Payer: Medicare PPO | Admitting: Family Medicine

## 2023-05-10 ENCOUNTER — Encounter: Payer: Self-pay | Admitting: Family Medicine

## 2023-05-10 VITALS — BP 116/74 | HR 69 | Temp 98.0°F | Ht 61.5 in | Wt 143.2 lb

## 2023-05-10 DIAGNOSIS — R059 Cough, unspecified: Secondary | ICD-10-CM

## 2023-05-10 MED ORDER — ALBUTEROL SULFATE HFA 108 (90 BASE) MCG/ACT IN AERS
1.0000 | INHALATION_SPRAY | Freq: Four times a day (QID) | RESPIRATORY_TRACT | 1 refills | Status: AC | PRN
Start: 2023-05-10 — End: ?

## 2023-05-10 MED ORDER — PREDNISONE 10 MG PO TABS: ORAL_TABLET | ORAL | 0 refills | Status: DC

## 2023-05-10 NOTE — Telephone Encounter (Signed)
Noted. Thanks. Will see at OV.  

## 2023-05-10 NOTE — Patient Instructions (Signed)
 Add on prednisone with food.  If needed, use albuterol.  The cough should gradually get better.  Keep using spiriva.  Update [place] as needed.  Take care.  Glad to see you.

## 2023-05-10 NOTE — Telephone Encounter (Signed)
 I spoke with pt; pt said finished z pak on 05/08/23. Pt still has a lot of prod cough with clear phlegm, slight H/A and nausea. Pt is not sure if has fever or not. Both eyes are itching but pt does not think the eyes are red. Pt scheduled appt with Dr Para March 05/10/23 at 3 pm with UC & ED precautions and pt voiced understanding. Sending note to Dr Para March and Para March pool.

## 2023-05-10 NOTE — Telephone Encounter (Signed)
 Patient is still feeling unwell and has a horrible cough she would like for nurse donna to call her back

## 2023-05-10 NOTE — Progress Notes (Signed)
 She couldn't take doxy past 6 days due to GI upset.  Then took and completed a course of azithromycin.  Some nausea w/o vomiting recently, attributed to azithromycin.  Cough improved some.  Less sputum now.  Still with some cough.  Still on spiriva.  More head congestion.  No facial pain.  No ear pain.  No fevers.  Some wheeze.  No recent SABA use.    Meds, vitals, and allergies reviewed.   ROS: Per HPI unless specifically indicated in ROS section   Nad Ncat R canal with cerumen  L canal and TM wnl Nasal exam stuffy.  OP wnl.  Sinuses not ttp x4 Neck supple no LA Rrr Ctab No BLE edema.

## 2023-05-13 NOTE — Assessment & Plan Note (Signed)
 Discussed options. Okay for outpatient f/u.  Add on prednisone with food.  If needed, use albuterol.  Steroid cautions d/w pt.  The cough should gradually get better.  Keep using spiriva.  Update [place] as needed.

## 2023-05-17 ENCOUNTER — Other Ambulatory Visit: Payer: Self-pay | Admitting: Family Medicine

## 2023-05-23 ENCOUNTER — Ambulatory Visit
Admission: RE | Admit: 2023-05-23 | Discharge: 2023-05-23 | Disposition: A | Discharge status: Home / Self Care | Payer: Medicare PPO | Source: Ambulatory Visit | Attending: Family Medicine | Admitting: Family Medicine

## 2023-05-23 DIAGNOSIS — Z1231 Encounter for screening mammogram for malignant neoplasm of breast: Secondary | ICD-10-CM | POA: Diagnosis not present

## 2023-06-16 ENCOUNTER — Other Ambulatory Visit: Payer: Self-pay | Admitting: Family Medicine

## 2023-06-18 NOTE — Telephone Encounter (Signed)
 Last office visit 05/10/23 with Dr. Para March for cough.  Last refilled 04/23/2023 for 30 g with no refills.  Next Appt; CPE 07/09/2023.

## 2023-06-19 ENCOUNTER — Telehealth: Payer: Self-pay | Admitting: *Deleted

## 2023-06-19 DIAGNOSIS — R7303 Prediabetes: Secondary | ICD-10-CM

## 2023-06-19 DIAGNOSIS — E559 Vitamin D deficiency, unspecified: Secondary | ICD-10-CM

## 2023-06-19 DIAGNOSIS — Z79899 Other long term (current) drug therapy: Secondary | ICD-10-CM

## 2023-06-19 DIAGNOSIS — E782 Mixed hyperlipidemia: Secondary | ICD-10-CM

## 2023-06-19 NOTE — Telephone Encounter (Signed)
-----   Message from Alvina Chou sent at 06/18/2023 11:24 AM EDT ----- Regarding: Lab orders for Mon, 4.21.25 Patient is scheduled for CPX labs, please order future labs, Thanks , Camelia Eng

## 2023-07-02 ENCOUNTER — Other Ambulatory Visit (INDEPENDENT_AMBULATORY_CARE_PROVIDER_SITE_OTHER): Payer: Medicare PPO

## 2023-07-02 DIAGNOSIS — E782 Mixed hyperlipidemia: Secondary | ICD-10-CM

## 2023-07-02 DIAGNOSIS — Z79899 Other long term (current) drug therapy: Secondary | ICD-10-CM | POA: Diagnosis not present

## 2023-07-02 DIAGNOSIS — E559 Vitamin D deficiency, unspecified: Secondary | ICD-10-CM

## 2023-07-02 DIAGNOSIS — R7303 Prediabetes: Secondary | ICD-10-CM | POA: Diagnosis not present

## 2023-07-02 LAB — BASIC METABOLIC PANEL WITH GFR
BUN: 10 mg/dL (ref 6–23)
CO2: 27 meq/L (ref 19–32)
Calcium: 9.1 mg/dL (ref 8.4–10.5)
Chloride: 98 meq/L (ref 96–112)
Creatinine, Ser: 0.86 mg/dL (ref 0.40–1.20)
GFR: 61.39 mL/min (ref >60.00)
Glucose, Bld: 93 mg/dL (ref 70–99)
Potassium: 3.7 meq/L (ref 3.5–5.1)
Sodium: 131 meq/L — ABNORMAL LOW (ref 135–145)

## 2023-07-02 LAB — CBC WITH DIFFERENTIAL/PLATELET
Basophils Absolute: 0.1 10*3/uL (ref 0.0–0.1)
Basophils Relative: 1 % (ref 0.0–3.0)
Eosinophils Absolute: 0.5 10*3/uL (ref 0.0–0.7)
Eosinophils Relative: 10.2 % — ABNORMAL HIGH (ref 0.0–5.0)
HCT: 39.4 % (ref 36.0–46.0)
Hemoglobin: 13.2 g/dL (ref 12.0–15.0)
Lymphocytes Relative: 22.9 % (ref 12.0–46.0)
Lymphs Abs: 1.2 10*3/uL (ref 0.7–4.0)
MCHC: 33.4 g/dL (ref 30.0–36.0)
MCV: 90.7 fl (ref 78.0–100.0)
Monocytes Absolute: 0.5 10*3/uL (ref 0.1–1.0)
Monocytes Relative: 8.7 % (ref 3.0–12.0)
Neutro Abs: 3 10*3/uL (ref 1.4–7.7)
Neutrophils Relative %: 57.2 % (ref 43.0–77.0)
Platelets: 239 10*3/uL (ref 150.0–400.0)
RBC: 4.35 Mil/uL (ref 3.87–5.11)
RDW: 13.7 % (ref 11.5–15.5)
WBC: 5.3 10*3/uL (ref 4.0–10.5)

## 2023-07-02 LAB — HEPATIC FUNCTION PANEL
ALT: 17 U/L (ref 0–35)
AST: 18 U/L (ref 0–37)
Albumin: 4.1 g/dL (ref 3.5–5.2)
Alkaline Phosphatase: 61 U/L (ref 39–117)
Bilirubin, Direct: 0.2 mg/dL (ref 0.0–0.3)
Total Bilirubin: 0.8 mg/dL (ref 0.2–1.2)
Total Protein: 6.4 g/dL (ref 6.0–8.3)

## 2023-07-02 LAB — TSH: TSH: 2.5 u[IU]/mL (ref 0.35–5.50)

## 2023-07-02 LAB — LIPID PANEL
Cholesterol: 141 mg/dL (ref 0–200)
HDL: 63.6 mg/dL (ref >39.00)
LDL Cholesterol: 62 mg/dL (ref 0–99)
NonHDL: 76.94
Total CHOL/HDL Ratio: 2
Triglycerides: 77 mg/dL (ref 0.0–149.0)
VLDL: 15.4 mg/dL (ref 0.0–40.0)

## 2023-07-02 LAB — HEMOGLOBIN A1C: Hgb A1c MFr Bld: 6 % (ref 4.6–6.5)

## 2023-07-02 LAB — VITAMIN D 25 HYDROXY (VIT D DEFICIENCY, FRACTURES): VITD: 91.5 ng/mL (ref 30.00–100.00)

## 2023-07-06 ENCOUNTER — Ambulatory Visit: Payer: Medicare PPO

## 2023-07-06 VITALS — Ht 61.5 in | Wt 143.0 lb

## 2023-07-06 DIAGNOSIS — Z0000 Encounter for general adult medical examination without abnormal findings: Secondary | ICD-10-CM

## 2023-07-06 NOTE — Patient Instructions (Addendum)
 Rebecca Hamilton , Thank you for taking time to come for your Medicare Wellness Visit. I appreciate your ongoing commitment to your health goals. Please review the following plan we discussed and let me know if I can assist you in the future.   Referrals/Orders/Follow-Ups/Clinician Recommendations: none  This is a list of the screening recommended for you and due dates:  Health Maintenance  Topic Date Due   COVID-19 Vaccine (7 - Pfizer risk 2024-25 season) 06/26/2023   Flu Shot  10/12/2023   Mammogram  05/22/2024   Medicare Annual Wellness Visit  07/05/2024   DTaP/Tdap/Td vaccine (4 - Td or Tdap) 08/04/2030   Pneumonia Vaccine  Completed   DEXA scan (bone density measurement)  Completed   Zoster (Shingles) Vaccine  Completed   HPV Vaccine  Aged Out   Meningitis B Vaccine  Aged Out    Advanced directives: (In Chart) A copy of your advanced directives are scanned into your chart should your provider ever need it.  Next Medicare Annual Wellness Visit scheduled for next year: Yes 07/08/24 @ 8:50am televisit

## 2023-07-06 NOTE — Progress Notes (Signed)
 Please attest and cosign this visit due to patients primary care provider not being in the office at the time the visit was completed.    Subjective:   Rebecca Hamilton is a 88 y.o. who presents for a Medicare Wellness preventive visit.  Visit Complete: Virtual I connected with  Rebecca Hamilton on 07/06/23 by a audio enabled telemedicine application and verified that I am speaking with the correct person using two identifiers.  Patient Location: Home  Provider Location: Office/Clinic  I discussed the limitations of evaluation and management by telemedicine. The patient expressed understanding and agreed to proceed.  Vital Signs: Because this visit was a virtual/telehealth visit, some criteria may be missing or patient reported. Any vitals not documented were not able to be obtained and vitals that have been documented are patient reported.  VideoDeclined- This patient declined Librarian, academic. Therefore the visit was completed with audio only.  Persons Participating in Visit: Patient.  AWV Questionnaire: No: Patient Medicare AWV questionnaire was not completed prior to this visit.  Cardiac Risk Factors include: advanced age (>49men, >67 women);dyslipidemia;hypertension     Objective:    Today's Vitals   07/06/23 0856  Weight: 143 lb (64.9 kg)  Height: 5' 1.5" (1.562 m)   Body mass index is 26.58 kg/m.     07/06/2023    9:13 AM 07/05/2020   12:02 PM 04/18/2019    9:48 AM 11/05/2018   10:24 AM 10/08/2018    7:49 AM  Advanced Directives  Does Patient Have a Medical Advance Directive? Yes Yes Yes Yes   Type of Estate agent of [place];Living will Healthcare Power of [place];Living will Healthcare Power of [place];Living will Healthcare Power of [place];Living will Healthcare Power of [place];Living will  Does patient want to make changes to medical advance directive?    No - Patient declined No - Patient declined  Copy  of Healthcare Power of Attorney in Chart? Yes - validated most recent copy scanned in chart (See row information) No - copy requested Yes - validated most recent copy scanned in chart (See row information) No - copy requested Yes - validated most recent copy scanned in chart (See row information)    Current Medications (verified) Outpatient Encounter Medications as of 07/06/2023  Medication Sig   albuterol  (VENTOLIN  HFA) 108 (90 Base) MCG/ACT inhaler Inhale 1-2 puffs into the lungs every 6 (six) hours as needed for wheezing or shortness of breath.   amLODipine  (NORVASC ) 5 MG tablet TAKE ONE TABLET BY MOUTH ONCE DAILY   atorvastatin  (LIPITOR) 10 MG tablet TAKE ONE TABLET BY MOUTH ONCE DAILY (NEED OFFICE VISIT)   betamethasone  valerate ointment (VALISONE ) 0.1 % APPLY ONE APPLICATION TOPICALLY TWO (TWO) TIMES DAILY.   Calcium -Vitamin D -Vitamin K (VIACTIV CALCIUM  PLUS D) 650-12.5-40 MG-MCG-MCG CHEW Chew 2 each by mouth daily.   Cholecalciferol (VITAMIN D ) 2000 UNITS CAPS Take by mouth 2 (two) times daily.   dorzolamide (TRUSOPT) 2 % ophthalmic solution Place 1 drop into the right eye 2 (two) times daily.   hydrochlorothiazide  (MICROZIDE ) 12.5 MG capsule TAKE ONE CAPSULE BY MOUTH ONCE DAILY   latanoprost (XALATAN) 0.005 % ophthalmic solution Place 1 drop into both eyes daily.   losartan  (COZAAR ) 50 MG tablet TAKE ONE TABLET BY MOUTH ONCE DAILY   Multiple Vitamins-Minerals (ICAPS AREDS 2) CAPS Take 1 each by mouth 2 (two) times daily.   SPIRIVA  HANDIHALER 18 MCG inhalation capsule INHALE CONTENTS OF ONE CAPSULES ONCE DAILY   chlorpheniramine-HYDROcodone  (TUSSIONEX)  10-8 MG/5ML TAKE FIVE MLS BY MOUTH EVERY 12 HOURS AS NEEDED FOR COUGH (Patient not taking: Reported on 07/06/2023)   predniSONE  (DELTASONE ) 10 MG tablet Take 2 a day for 5 days, then 1 a day for 5 days, with food. Don't take with aleve /ibuprofen. (Patient not taking: Reported on 07/06/2023)   No facility-administered encounter medications on  file as of 07/06/2023.    Allergies (verified) Codeine   History: Past Medical History:  Diagnosis Date   Colon polyps    COPD (chronic obstructive pulmonary disease) (HCC)    GERD (gastroesophageal reflux disease)    HTN (hypertension)    Hyperlipidemia    Osteopenia    Past Surgical History:  Procedure Laterality Date   CATARACT EXTRACTION W/PHACO Left 10/08/2018   Procedure: CATARACT EXTRACTION PHACO AND INTRAOCULAR LENS PLACEMENT (IOC) LEFT;  Surgeon: Clair Crews, MD;  Location: [facility];  Service: Ophthalmology;  Laterality: Left;   CATARACT EXTRACTION W/PHACO Right 11/05/2018   Procedure: CATARACT EXTRACTION PHACO AND INTRAOCULAR LENS PLACEMENT (IOC) - right  00:34  15.3%  5.32;  Surgeon: Clair Crews, MD;  Location: [facility];  Service: Ophthalmology;  Laterality: Right;   COLONOSCOPY     DENTAL SURGERY     EYE SURGERY     Family History  Problem Relation Age of Onset   Diabetes Father    Heart disease Father    Diabetes Brother    Heart disease Mother    Breast cancer Neg Hx    Social History   Socioeconomic History   Marital status: Married    Spouse name: Not on file   Number of children: 2   Years of education: Not on file   Highest education level: Not on file  Occupational History   Occupation: retired    Associate Professor: retired  Tobacco Use   Smoking status: Former    Current packs/day: 0.00    Types: Cigarettes    Quit date: 1988    Years since quitting: 37.3   Smokeless tobacco: Never   Tobacco comments:    quit 1988  Vaping Use   Vaping status: Never Used  Substance and Sexual Activity   Alcohol use: Yes    Alcohol/week: 2.0 standard drinks of alcohol    Types: 1 Glasses of wine, 1 Shots of liquor per week    Comment: daily   Drug use: No   Sexual activity: Not on file  Other Topics Concern   Not on file  Social History Narrative   Regular exercise--no   Social Drivers of Health   Financial Resource Strain:  Low Risk  (07/06/2023)   Overall Financial Resource Strain (CARDIA)    Difficulty of Paying Living Expenses: Not hard at all  Food Insecurity: No Food Insecurity (07/06/2023)   Hunger Vital Sign    Worried About Running Out of Food in the Last Year: Never true    Ran Out of Food in the Last Year: Never true  Transportation Needs: No Transportation Needs (07/06/2023)   PRAPARE - Administrator, Civil Service (Medical): No    Lack of Transportation (Non-Medical): No  Physical Activity: Insufficiently Active (07/06/2023)   Exercise Vital Sign    Days of Exercise per Week: 2 days    Minutes of Exercise per Session: 30 min  Stress: No Stress Concern Present (07/06/2023)   [employer] of Occupational Health - Occupational Stress Questionnaire    Feeling of Stress : Not at all  Social Connections: Socially Integrated (  07/06/2023)   Social Connection and Isolation Panel [NHANES]    Frequency of Communication with Friends and Family: More than three times a week    Frequency of Social Gatherings with Friends and Family: More than three times a week    Attends Religious Services: More than 4 times per year    Active Member of [employer] or Organizations: Not on file    Attends Engineer, structural: More than 4 times per year    Marital Status: Married    Tobacco Counseling Counseling given: Not Answered Tobacco comments: quit 1988    Clinical Intake:  Pre-visit preparation completed: Yes  Pain : No/denies pain     BMI - recorded: 26.58 Nutritional Status: BMI 25 -29 Overweight Nutritional Risks: None Diabetes: No  Lab Results  Component Value Date   HGBA1C 6.0 07/02/2023   HGBA1C 5.9 06/28/2022   HGBA1C 6.0 06/23/2021     How often do you need to have someone help you when you read instructions, pamphlets, or other written materials from your doctor or pharmacy?: 1 - Never  Interpreter Needed?: No  Comments: lives with husband Information entered by ::  Rebecca Wilms,LPN   Activities of Daily Living     07/06/2023    9:15 AM  In your present state of health, do you have any difficulty performing the following activities:  Hearing? 0  Vision? 0  Difficulty concentrating or making decisions? 0  Walking or climbing stairs? 0  Dressing or bathing? 0  Doing errands, shopping? 0  Preparing Food and eating ? N  Using the Toilet? N  In the past six months, have you accidently leaked urine? N  Do you have problems with loss of bowel control? N  Managing your Medications? N  Managing your Finances? N  Housekeeping or managing your Housekeeping? N    Patient Care Team: Scherrie Curt, MD as PCP - General Copland, Jolena Nay, MD  Indicate any recent Medical Services you may have received from other than [facility] providers in the past year (date may be approximate).     Assessment:   This is a routine wellness examination for Rebecca Hamilton.  Hearing/Vision screen Hearing Screening - Comments:: Pt says her hearing is good with hearing aids [facility] Vision Screening - Comments:: Pt says her vision is good w/glasses [facility]   Goals Addressed             This Visit's Progress    Patient Stated   On track    07/06/23- I will maintain and stay healthy. Very anxious to get back on the golf course.     Patient Stated       07/06/23-, I will maintain and continue medications as prescribed.      Patient Stated       I would like to clear up the bronchial congestion and mucous;possible pulmonologist       Depression Screen     07/06/2023    9:08 AM 07/05/2022    9:08 AM 06/30/2021   10:50 AM 07/05/2020   12:03 PM 04/18/2019    9:49 AM 04/10/2018    9:16 AM 04/09/2017   10:54 AM  PHQ 2/9 Scores  PHQ - 2 Score 0 0 0 0 1 0 0  PHQ- 9 Score    0 1      Fall Risk     07/06/2023    9:00 AM 07/05/2022    9:08 AM 06/30/2021   10:50 AM 07/05/2020   12:03  PM 04/18/2019    9:48 AM  Fall Risk   Falls in the past year? 1 0 0 1 1  Comment      tripped on crack in driveway  Number falls in past yr: 0 0  0 0  Injury with Fall? 0 0  0 0  Risk for fall due to : No Fall Risks No Fall Risks  Medication side effect History of fall(s);Medication side effect  Follow up Education provided;Falls prevention discussed Falls evaluation completed  Falls evaluation completed;Falls prevention discussed Falls evaluation completed;Falls prevention discussed    MEDICARE RISK AT HOME:  Medicare Risk at Home Any stairs in or around the home?: Yes If so, are there any without handrails?: Yes Home free of loose throw rugs in walkways, pet beds, electrical cords, etc?: Yes Adequate lighting in your home to reduce risk of falls?: Yes Life alert?: No Use of a cane, walker or w/c?: No Grab bars in the bathroom?: Yes Shower chair or bench in shower?: Yes Elevated toilet seat or a handicapped toilet?: Yes  TIMED UP AND GO:  Was the test performed?  No  Cognitive Function: 6CIT completed    07/05/2020   12:06 PM 04/18/2019    9:51 AM  MMSE - Mini Mental State Exam  Orientation to time 5 5  Orientation to Place 5 5  Registration 3 3  Attention/ Calculation 5 5  Recall 3 3  Language- repeat 1 1        Immunizations Immunization History  Administered Date(s) Administered   Fluad Quad(high Dose 65+) 01/07/2019, 01/05/2023   Hepatitis A 06/26/1995, 12/26/1995   Hepatitis B 12/01/2005, 01/01/2006, 07/06/2006   Influenza Split 12/29/2011   Influenza Whole 03/14/2007   Influenza,inj,Quad PF,6+ Mos 12/03/2013, 01/11/2015, 01/18/2016, 01/04/2017, 12/24/2017   Influenza-Unspecified 12/11/2012   Moderna Covid-19 Vaccine Bivalent Booster 5yrs & up 12/10/2020   PFIZER(Purple Top)SARS-COV-2 Vaccination 03/19/2019, 04/08/2019, 12/12/2019   Pfizer(Comirnaty)Fall Seasonal Vaccine 12 years and older 12/26/2022   Pneumococcal Conjugate-13 12/03/2013   Pneumococcal Polysaccharide-23 03/13/2005, 12/01/2005   Td 03/13/2005, 12/01/2005   Tdap 08/03/2020    Zoster Recombinant(Shingrix) 12/27/2017, 03/25/2018   Zoster, Live 07/06/2006    Screening Tests Health Maintenance  Topic Date Due   COVID-19 Vaccine (7 - Pfizer risk 2024-25 season) 06/26/2023   INFLUENZA VACCINE  10/12/2023   MAMMOGRAM  05/22/2024   Medicare Annual Wellness (AWV)  07/05/2024   DTaP/Tdap/Td (4 - Td or Tdap) 08/04/2030   Pneumonia Vaccine 32+ Years old  Completed   DEXA SCAN  Completed   Zoster Vaccines- Shingrix  Completed   HPV VACCINES  Aged Out   Meningococcal B Vaccine  Aged Out    Health Maintenance  Health Maintenance Due  Topic Date Due   COVID-19 Vaccine (7 - Pfizer risk 2024-25 season) 06/26/2023   Health Maintenance Items Addressed: None needed at this time  Additional Screening:  Vision Screening: Recommended annual ophthalmology exams for early detection of glaucoma and other disorders of the eye.  Dental Screening: Recommended annual dental exams for proper oral hygiene  Community Resource Referral / Chronic Care Management: CRR required this visit?  No   CCM required this visit?  No     Plan:     I have personally reviewed and noted the following in the patient's chart:   Medical and social history Use of alcohol, tobacco or illicit drugs  Current medications and supplements including opioid prescriptions. Patient is not currently taking opioid prescriptions. Functional ability and status Nutritional  status Physical activity Advanced directives List of other physicians Hospitalizations, surgeries, and ER visits in previous 12 months Vitals Screenings to include cognitive, depression, and falls Referrals and appointments  In addition, I have reviewed and discussed with patient certain preventive protocols, quality metrics, and best practice recommendations. A written personalized care plan for preventive services as well as general preventive health recommendations were provided to patient.     Rebecca Bannister,  LPN   [id number]   After Visit Summary: (MyChart) Due to this being a telephonic visit, the after visit summary with patients personalized plan was offered to patient via MyChart   Notes: Please refer to Routing Comments.

## 2023-07-08 NOTE — Progress Notes (Signed)
She has an appointment with me tomorrow.  

## 2023-07-08 NOTE — Progress Notes (Unsigned)
 Micki Cassel T. Kyrie Fludd, MD, CAQ Sports Medicine [facility] [address] [place], [address]  Phone: [phone]  FAX: [phone]  Rebecca Hamilton - 88 y.o. female  MRN [id number]  Date of Birth: 1935/08/16  Date: 07/09/2023  PCP: Scherrie Curt, MD  Referral: Scherrie Curt, MD  No chief complaint on file.  Patient Care Team: Scherrie Curt, MD as PCP - General Novia Lansberry, Jolena Nay, MD Subjective:   Rebecca Hamilton is a 88 y.o. pleasant patient who presents with the following:  Health Maintenance Summary Reviewed and updated, unless pt declines services.  Tobacco History Reviewed. Non-smoker Alcohol: No concerns, no excessive use Exercise Habits: Some activity, rec at least 30 mins 5 times a week STD concerns: none Drug Use: None Lumps or breast concerns: no  Patient presents, and she had some questions about having a chronic cough.  She does have some underlying COPD.  RSV?    Health Maintenance  Topic Date Due   COVID-19 Vaccine (7 - Pfizer risk 2024-25 season) 06/26/2023   INFLUENZA VACCINE  10/12/2023   MAMMOGRAM  05/22/2024   Medicare Annual Wellness (AWV)  07/05/2024   DTaP/Tdap/Td (4 - Td or Tdap) 08/04/2030   Pneumonia Vaccine 25+ Years old  Completed   DEXA SCAN  Completed   Zoster Vaccines- Shingrix  Completed   HPV VACCINES  Aged Out   Meningococcal B Vaccine  Aged Out    Immunization History  Administered Date(s) Administered   Fluad Quad(high Dose 65+) 01/07/2019, 01/05/2023   Hepatitis A 06/26/1995, 12/26/1995   Hepatitis B 12/01/2005, 01/01/2006, 07/06/2006   Influenza Split 12/29/2011   Influenza Whole 03/14/2007   Influenza,inj,Quad PF,6+ Mos 12/03/2013, 01/11/2015, 01/18/2016, 01/04/2017, 12/24/2017   Influenza-Unspecified 12/11/2012   Moderna Covid-19 Vaccine Bivalent Booster 67yrs & up 12/10/2020   PFIZER(Purple Top)SARS-COV-2 Vaccination 03/19/2019, 04/08/2019, 12/12/2019    Pfizer(Comirnaty)Fall Seasonal Vaccine 12 years and older 12/26/2022   Pneumococcal Conjugate-13 12/03/2013   Pneumococcal Polysaccharide-23 03/13/2005, 12/01/2005   Td 03/13/2005, 12/01/2005   Tdap 08/03/2020   Zoster Recombinant(Shingrix) 12/27/2017, 03/25/2018   Zoster, Live 07/06/2006   Patient Active Problem List   Diagnosis Date Noted   Hyperlipidemia 05/22/2008    Priority: Medium    Essential hypertension 05/22/2008    Priority: Medium    COPD (chronic obstructive pulmonary disease) (HCC) 05/22/2008    Priority: Medium    UNSPECIFIED ANEMIA 05/22/2008   GERD 05/22/2008   History of colonic polyps 05/22/2008    Past Medical History:  Diagnosis Date   Colon polyps    COPD (chronic obstructive pulmonary disease) (HCC)    GERD (gastroesophageal reflux disease)    HTN (hypertension)    Hyperlipidemia    Osteopenia     Past Surgical History:  Procedure Laterality Date   CATARACT EXTRACTION W/PHACO Left 10/08/2018   Procedure: CATARACT EXTRACTION PHACO AND INTRAOCULAR LENS PLACEMENT (IOC) LEFT;  Surgeon: Clair Crews, MD;  Location: [facility];  Service: Ophthalmology;  Laterality: Left;   CATARACT EXTRACTION W/PHACO Right 11/05/2018   Procedure: CATARACT EXTRACTION PHACO AND INTRAOCULAR LENS PLACEMENT (IOC) - right  00:34  15.3%  5.32;  Surgeon: Clair Crews, MD;  Location: [facility];  Service: Ophthalmology;  Laterality: Right;   COLONOSCOPY     DENTAL SURGERY     EYE SURGERY      Family History  Problem Relation Age of Onset   Diabetes Father    Heart disease Father    Diabetes Brother  Heart disease Mother    Breast cancer Neg Hx     Social History   Social History Narrative   Regular exercise--no    Past Medical History, Surgical History, Social History, Family History, Problem List, Medications, and Allergies have been reviewed and updated if relevant.  Review of Systems: Pertinent positives are listed above.  Otherwise, a  full 14 point review of systems has been done in full and it is negative except where it is noted positive.  Objective:   There were no vitals taken for this visit. Ideal Body Weight:   No results found.    07/06/2023    9:08 AM 07/05/2022    9:08 AM 06/30/2021   10:50 AM 07/05/2020   12:03 PM 04/18/2019    9:49 AM  Depression screen PHQ 2/9  Decreased Interest 0 0 0 0 0  Down, Depressed, Hopeless 0 0 0 0 1  PHQ - 2 Score 0 0 0 0 1  Altered sleeping    0 0  Tired, decreased energy    0 0  Change in appetite    0 0  Feeling bad or failure about yourself     0 0  Trouble concentrating    0 0  Moving slowly or fidgety/restless    0 0  Suicidal thoughts    0 0  PHQ-9 Score    0 1  Difficult doing work/chores    Not difficult at all Not difficult at all     GEN: well developed, well nourished, no acute distress Eyes: conjunctiva and lids normal, PERRLA, EOMI ENT: TM clear, nares clear, oral exam WNL Neck: supple, no lymphadenopathy, no thyromegaly, no JVD Pulm: clear to auscultation and percussion, respiratory effort normal CV: regular rate and rhythm, S1-S2, no murmur, rub or gallop, no bruits Chest: no scars, masses, no lumps BREAST: breast exam declined GI: soft, non-tender; no hepatosplenomegaly, masses; active bowel sounds all quadrants GU: GU exam declined Lymph: no cervical, axillary or inguinal adenopathy MSK: gait normal, muscle tone and strength WNL, no joint swelling, effusions, discoloration, crepitus  SKIN: clear, good turgor, color WNL, no rashes, lesions, or ulcerations Neuro: normal mental status, normal strength, sensation, and motion Psych: alert; oriented to person, place and time, normally interactive and not anxious or depressed in appearance.   All labs reviewed with patient. Results for orders placed or performed in visit on 07/02/23  VITAMIN D  25 Hydroxy (Vit-D Deficiency, Fractures)   Collection Time: 07/02/23  8:47 AM  Result Value Ref Range   VITD  91.50 30.00 - 100.00 ng/mL  TSH   Collection Time: 07/02/23  8:47 AM  Result Value Ref Range   TSH 2.50 0.35 - 5.50 uIU/mL  Lipid panel   Collection Time: 07/02/23  8:47 AM  Result Value Ref Range   Cholesterol 141 0 - 200 mg/dL   Triglycerides 16.1 0.0 - 149.0 mg/dL   HDL 09.60 >45.40 mg/dL   VLDL 98.1 0.0 - 19.1 mg/dL   LDL Cholesterol 62 0 - 99 mg/dL   Total CHOL/HDL Ratio 2    NonHDL 76.94   Hemoglobin A1c   Collection Time: 07/02/23  8:47 AM  Result Value Ref Range   Hgb A1c MFr Bld 6.0 4.6 - 6.5 %  Hepatic function panel   Collection Time: 07/02/23  8:47 AM  Result Value Ref Range   Total Bilirubin 0.8 0.2 - 1.2 mg/dL   Bilirubin, Direct 0.2 0.0 - 0.3 mg/dL   Alkaline Phosphatase 61 39 -  117 U/L   AST 18 0 - 37 U/L   ALT 17 0 - 35 U/L   Total Protein 6.4 6.0 - 8.3 g/dL   Albumin 4.1 3.5 - 5.2 g/dL  CBC with Differential/Platelet   Collection Time: 07/02/23  8:47 AM  Result Value Ref Range   WBC 5.3 4.0 - 10.5 K/uL   RBC 4.35 3.87 - 5.11 Mil/uL   Hemoglobin 13.2 12.0 - 15.0 g/dL   HCT 16.1 09.6 - 04.5 %   MCV 90.7 78.0 - 100.0 fl   MCHC 33.4 30.0 - 36.0 g/dL   RDW 40.9 81.1 - 91.4 %   Platelets 239.0 150.0 - 400.0 K/uL   Neutrophils Relative % 57.2 43.0 - 77.0 %   Lymphocytes Relative 22.9 12.0 - 46.0 %   Monocytes Relative 8.7 3.0 - 12.0 %   Eosinophils Relative 10.2 (H) 0.0 - 5.0 %   Basophils Relative 1.0 0.0 - 3.0 %   Neutro Abs 3.0 1.4 - 7.7 K/uL   Lymphs Abs 1.2 0.7 - 4.0 K/uL   Monocytes Absolute 0.5 0.1 - 1.0 K/uL   Eosinophils Absolute 0.5 0.0 - 0.7 K/uL   Basophils Absolute 0.1 0.0 - 0.1 K/uL  Basic metabolic panel   Collection Time: 07/02/23  8:47 AM  Result Value Ref Range   Sodium 131 (L) 135 - 145 mEq/L   Potassium 3.7 3.5 - 5.1 mEq/L   Chloride 98 96 - 112 mEq/L   CO2 27 19 - 32 mEq/L   Glucose, Bld 93 70 - 99 mg/dL   BUN 10 6 - 23 mg/dL   Creatinine, Ser 7.82 0.40 - 1.20 mg/dL   GFR 95.62 >13.08 mL/min   Calcium  9.1 8.4 - 10.5 mg/dL    No results found.  Assessment and Plan:     ICD-10-CM   1. Healthcare maintenance  Z00.00       Health Maintenance Exam: The patient's preventative maintenance and recommended screening tests for an annual wellness exam were reviewed in full today. Brought up to date unless services declined.  Counselled on the importance of diet, exercise, and its role in overall health and mortality. The patient's FH and SH was reviewed, including their home life, tobacco status, and drug and alcohol status.  Follow-up in 1 year for physical exam or additional follow-up below.  Disposition: No follow-ups on file.  Future Appointments  Date Time Provider Department Center  07/09/2023  9:00 AM Levaeh Vice, Jolena Nay, MD [facility]  07/08/2024  8:50 AM [facility] ANNUAL WELLNESS VISIT 1 [facility]    No orders of the defined types were placed in this encounter.  There are no discontinued medications. No orders of the defined types were placed in this encounter.   Signed,  Ranny Bye. Ege Muckey, MD   Allergies as of 07/09/2023       Reactions   Codeine Nausea Only        Medication List        Accurate as of July 08, 2023  5:31 PM. If you have any questions, ask your nurse or doctor.          albuterol  108 (90 Base) MCG/ACT inhaler Commonly known as: VENTOLIN  HFA Inhale 1-2 puffs into the lungs every 6 (six) hours as needed for wheezing or shortness of breath.   amLODipine  5 MG tablet Commonly known as: NORVASC  TAKE ONE TABLET BY MOUTH ONCE DAILY   atorvastatin  10 MG tablet Commonly known as: LIPITOR TAKE ONE TABLET BY MOUTH ONCE  DAILY (NEED OFFICE VISIT)   betamethasone  valerate ointment 0.1 % Commonly known as: VALISONE  APPLY ONE APPLICATION TOPICALLY TWO (TWO) TIMES DAILY.   chlorpheniramine-HYDROcodone  10-8 MG/5ML Commonly known as: TUSSIONEX TAKE FIVE MLS BY MOUTH EVERY 12 HOURS AS NEEDED FOR COUGH   dorzolamide 2 % ophthalmic solution Commonly known as:  TRUSOPT Place 1 drop into the right eye 2 (two) times daily.   hydrochlorothiazide  12.5 MG capsule Commonly known as: MICROZIDE  TAKE ONE CAPSULE BY MOUTH ONCE DAILY   ICaps Areds 2 Caps Take 1 each by mouth 2 (two) times daily.   latanoprost 0.005 % ophthalmic solution Commonly known as: XALATAN Place 1 drop into both eyes daily.   losartan  50 MG tablet Commonly known as: COZAAR  TAKE ONE TABLET BY MOUTH ONCE DAILY   predniSONE  10 MG tablet Commonly known as: DELTASONE  Take 2 a day for 5 days, then 1 a day for 5 days, with food. Don't take with aleve /ibuprofen.   Spiriva  HandiHaler 18 MCG inhalation capsule Generic drug: tiotropium INHALE CONTENTS OF ONE CAPSULES ONCE DAILY   Viactiv Calcium  Plus D 650-12.5-40 MG-MCG Chew Generic drug: Calcium -Vitamin D -Vitamin K Chew 2 each by mouth daily.   Vitamin D  50 MCG (2000 UT) Caps Take by mouth 2 (two) times daily.

## 2023-07-09 ENCOUNTER — Encounter: Payer: Self-pay | Admitting: Family Medicine

## 2023-07-09 ENCOUNTER — Ambulatory Visit (INDEPENDENT_AMBULATORY_CARE_PROVIDER_SITE_OTHER): Payer: Medicare PPO | Admitting: Family Medicine

## 2023-07-09 VITALS — BP 110/62 | HR 79 | Temp 98.4°F | Ht 61.5 in | Wt 143.0 lb

## 2023-07-09 DIAGNOSIS — J449 Chronic obstructive pulmonary disease, unspecified: Secondary | ICD-10-CM | POA: Diagnosis not present

## 2023-07-09 DIAGNOSIS — Z0000 Encounter for general adult medical examination without abnormal findings: Secondary | ICD-10-CM | POA: Diagnosis not present

## 2023-07-09 DIAGNOSIS — R21 Rash and other nonspecific skin eruption: Secondary | ICD-10-CM | POA: Diagnosis not present

## 2023-07-09 DIAGNOSIS — R053 Chronic cough: Secondary | ICD-10-CM | POA: Diagnosis not present

## 2023-07-09 MED ORDER — FLUTICASONE-SALMETEROL 115-21 MCG/ACT IN AERO: 2.0000 | INHALATION_SPRAY | Freq: Two times a day (BID) | RESPIRATORY_TRACT | 3 refills | Status: DC

## 2023-07-16 ENCOUNTER — Encounter: Payer: Self-pay | Admitting: Dermatology

## 2023-07-16 ENCOUNTER — Other Ambulatory Visit: Payer: Self-pay | Admitting: Family Medicine

## 2023-07-16 ENCOUNTER — Ambulatory Visit: Admitting: Dermatology

## 2023-07-16 DIAGNOSIS — L300 Nummular dermatitis: Secondary | ICD-10-CM

## 2023-07-16 NOTE — Progress Notes (Signed)
   Follow-Up Visit   Subjective  Rebecca Hamilton is a 88 y.o. female who presents for the following: rash mainly on limbs appeared x several months ago possible since Christmas, very itchy. Patient reports this has spread to other places, on top of foot x3 days ago. Patient has been using betamethasone  ointment prescribed from her PCP beginning of April, but has not cleared it up. Does not itch all the time.   The patient has spots, moles and lesions to be evaluated, some may be new or changing and the patient may have concern these could be cancer.   The following portions of the chart were reviewed this encounter and updated as appropriate: medications, allergies, medical history  Review of Systems:  No other skin or systemic complaints except as noted in HPI or Assessment and Plan.  Objective  Well appearing patient in no apparent distress; mood and affect are within normal limits.  A focused examination was performed of the following areas: Bilateral arms, legs, feet  Relevant exam findings are noted in the Assessment and Plan.      Assessment & Plan   Nummular Dermatitis Exam: Pink scaly crusted circular thin plaques on R dorsal foot x 2. Faint lesion on R knee. Postinflammatory erythema on L knee, L lower leg. Clearance of lesions on L arm  Chronic and persistent condition with duration or expected duration over one year. Condition is bothersome/symptomatic for patient. Currently flared.   Nummular dermatitis (eczema) is a chronic, relapsing, itchy rash that can significantly affect quality of life. It is often associated with dry skin and flares in the wintertime, and may require treatment with prescription topical anti-inflammatory medications, in addition to gentle skin care.  If there is associated atopic dermatitis and topicals are not working, then biologic injections may be necessary to clear rash and control symptoms.  Treatment Plan: Lesions began during winter,  patient reports lesions improve with betamethasone  and eventually resolve, lesions are not annular in appearance, occur bilaterally and symmetrically, no evidence of tinea pedis. Thus, more consistent with nummular eczema May continue betamethasone  valerate 0.1% ointment BID to aa. Patient advised to call us  with any changes and schedule an appointment if needed.   Topical steroids (such as triamcinolone, fluocinolone, fluocinonide, mometasone, clobetasol, halobetasol, betamethasone , hydrocortisone) can cause thinning and lightening of the skin if they are used for too long in the same area. Your physician has selected the right strength medicine for your problem and area affected on the body. Please use your medication only as directed by your physician to prevent side effects.   Recommend mild soap and moisturizing cream 1-2 times daily.  Gentle skin care handout provided. Recommend Eucerin or Cerave cream. Samples given to patient (eucerin cerave neutrogena aveeno).    NUMMULAR ECZEMA    Return if symptoms worsen or fail to improve.  I, Jacquelynn Vera, CMA, am acting as scribe for Harris Liming, MD .   Documentation: I have reviewed the above documentation for accuracy and completeness, and I agree with the above.  Harris Liming, MD

## 2023-07-16 NOTE — Patient Instructions (Signed)

## 2023-07-25 ENCOUNTER — Other Ambulatory Visit: Payer: Self-pay | Admitting: Family Medicine

## 2023-08-01 ENCOUNTER — Other Ambulatory Visit: Payer: Self-pay | Admitting: Family Medicine

## 2023-08-14 ENCOUNTER — Other Ambulatory Visit: Payer: Self-pay | Admitting: Family Medicine

## 2023-10-04 ENCOUNTER — Encounter: Payer: Self-pay | Admitting: Internal Medicine

## 2023-10-04 ENCOUNTER — Ambulatory Visit: Admitting: Internal Medicine

## 2023-10-04 VITALS — BP 134/80 | HR 72 | Ht 61.5 in | Wt 144.0 lb

## 2023-10-04 DIAGNOSIS — Z87891 Personal history of nicotine dependence: Secondary | ICD-10-CM | POA: Diagnosis not present

## 2023-10-04 DIAGNOSIS — J4489 Other specified chronic obstructive pulmonary disease: Secondary | ICD-10-CM

## 2023-10-04 DIAGNOSIS — J4540 Moderate persistent asthma, uncomplicated: Secondary | ICD-10-CM

## 2023-10-04 DIAGNOSIS — J301 Allergic rhinitis due to pollen: Secondary | ICD-10-CM

## 2023-10-04 NOTE — Progress Notes (Signed)
 Rebecca Hamilton    [id number]    1935/11/20  Primary Care Physician:Copland, Jacques, MD  Referring Physician: Watt Jacques, MD [address] [place],  [place] [address] Reason for Consultation: chronic congestion Date of Consultation: 10/04/2023  Chief complaint:   Chief Complaint  Patient presents with   Consult    Chronic congestion     HPI:  Discussed the use of AI scribe software for clinical note transcription with the patient, who gave verbal consent to proceed.  History of Present Illness Rebecca Hamilton is an 88 year old female with chronic bronchitis and COPD who presents with persistent cough and congestion. She is accompanied by Lupita, her caretaker. She was referred by Consuelo for evaluation of her respiratory symptoms.  She has a long-standing history of chronic bronchitis, typically experiencing symptoms twice a year during the spring and fall. Her symptoms include persistent cough and congestion, which sometimes become productive. She attributes these episodes to seasonal allergies and environmental allergens, noting that her symptoms tend to linger before resolving.  She has been using various treatments for her symptoms, including over-the-counter cough medicines and decongestants, which provide some relief. She has been prescribed prednisone  twice, but only found it effective once. Recently, she started using Advair, which has significantly improved her symptoms, describing the change as 'night and day'. She also uses Spiriva , which she has been on for many years, and albuterol  as needed, though she reports using it less than once a week.  Her past medical history includes a diagnosis of COPD. She has a history of smoking, having started at age 14 and quitting in 49. At her heaviest, she smoked close to three-quarters of a pack a day. She has never been hospitalized for pneumonia or bronchitis.  Family history is notable for her maternal  grandfather having emphysema. She has no children with lung problems and no family history of lung cancer.  Socially, she is a retired Chief Strategy Officer and has not traveled since [place]. She lives with Lupita and has no pets. She has not been exposed to secondhand smoke since quitting smoking.  No current itchy, watery eyes, runny nose, or sore throat. No history of childhood respiratory issues.    Social history:  Occupation: retired, professor of business Exposures: lives at home with husband, no pets.  Smoking history: former smoker quit in 1988  Social History   Occupational History   Occupation: retired    Associate Professor: retired  Tobacco Use   Smoking status: Former    Current packs/day: 0.00    Average packs/day: 0.8 packs/day for 30.0 years (22.5 ttl pk-yrs)    Types: Cigarettes    Start date: 28    Quit date: 1988    Years since quitting: 37.5    Passive exposure: Never   Smokeless tobacco: Never   Tobacco comments:    quit 1988  Vaping Use   Vaping status: Never Used  Substance and Sexual Activity   Alcohol use: Yes    Alcohol/week: 2.0 standard drinks of alcohol    Types: 1 Glasses of wine, 1 Shots of liquor per week    Comment: daily   Drug use: No   Sexual activity: Not Currently    Relevant family history:  Family History  Problem Relation Age of Onset   Heart disease Mother    Diabetes Father    Heart disease Father    Diabetes Brother    Emphysema Maternal Grandfather  Breast cancer Neg Hx    Lung cancer Neg Hx     Past Medical History:  Diagnosis Date   Colon polyps    COPD (chronic obstructive pulmonary disease) (HCC)    GERD (gastroesophageal reflux disease)    HTN (hypertension)    Hyperlipidemia    Osteopenia     Past Surgical History:  Procedure Laterality Date   CATARACT EXTRACTION W/PHACO Left 10/08/2018   Procedure: CATARACT EXTRACTION PHACO AND INTRAOCULAR LENS PLACEMENT (IOC) LEFT;  Surgeon: Jaye Fallow, MD;   Location: [facility];  Service: Ophthalmology;  Laterality: Left;   CATARACT EXTRACTION W/PHACO Right 11/05/2018   Procedure: CATARACT EXTRACTION PHACO AND INTRAOCULAR LENS PLACEMENT (IOC) - right  00:34  15.3%  5.32;  Surgeon: Jaye Fallow, MD;  Location: [facility];  Service: Ophthalmology;  Laterality: Right;   DENTAL SURGERY       Physical Exam: Blood pressure 134/80, pulse 72, height 5' 1.5 (1.562 m), weight 144 lb (65.3 kg), SpO2 98%. Gen:      No acute distress ENT:  no thrush, no nasal polyps, mucus membranes moist Lungs:    No increased respiratory effort, symmetric chest wall excursion, clear to auscultation bilaterally, no wheezes or crackles CV:         Regular rate and rhythm; no murmurs, rubs, or gallops.  No pedal edema Abd:      + bowel sounds; soft, non-tender; no distension MSK: no acute synovitis of DIP or PIP joints, no mechanics hands.  Skin:      Warm and dry; no rashes Neuro: normal speech, no focal facial asymmetry Psych: alert and oriented x3, normal mood and affect   Data Reviewed/Medical Decision Making:  Independent interpretation of tests: Imaging:  Review of patient's chest xray images feb 2025 revealed bilateral interstitial opacities c/w copd. The patient's images have been independently reviewed by me.    PFTs:  Labs:  Lab Results  Component Value Date   NA 131 (L) 07/02/2023   K 3.7 07/02/2023   CO2 27 07/02/2023   GLUCOSE 93 07/02/2023   BUN 10 07/02/2023   CREATININE 0.86 07/02/2023   CALCIUM  9.1 07/02/2023   GFR 61.39 07/02/2023   GFRNONAA 51.34 09/01/2009   Lab Results  Component Value Date   WBC 5.3 07/02/2023   HGB 13.2 07/02/2023   HCT 39.4 07/02/2023   MCV 90.7 07/02/2023   PLT 239.0 07/02/2023    Immunization status:  Immunization History  Administered Date(s) Administered   Fluad Quad(high Dose 65+) 01/07/2019, 01/05/2023   Hepatitis A 06/26/1995, 12/26/1995   Hepatitis B 12/01/2005, 01/01/2006,  07/06/2006   Influenza Split 12/29/2011   Influenza Whole 03/14/2007   Influenza,inj,Quad PF,6+ Mos 12/03/2013, 01/11/2015, 01/18/2016, 01/04/2017, 12/24/2017   Influenza-Unspecified 12/11/2012   Moderna Covid-19 Vaccine Bivalent Booster 22yrs & up 12/10/2020   PFIZER(Purple Top)SARS-COV-2 Vaccination 03/19/2019, 04/08/2019, 12/12/2019   Pfizer(Comirnaty)Fall Seasonal Vaccine 12 years and older 12/26/2022   Pneumococcal Conjugate-13 12/03/2013   Pneumococcal Polysaccharide-23 03/13/2005, 12/01/2005   Td 03/13/2005, 12/01/2005   Tdap 08/03/2020   Zoster Recombinant(Shingrix) 12/27/2017, 03/25/2018   Zoster, Live 07/06/2006     I reviewed prior external note(s) from pcp  I reviewed the result(s) of the labs and imaging as noted above.   I have ordered  Discussion of management or test interpretation with another colleague.   Assessment and Plan Assessment & Plan Chronic Obstructive Pulmonary Disease (COPD) with Asthma COPD with asthma overlap exacerbated by seasonal allergies. Responds well to Advair, indicating significant  asthma component. - Continue Advair (fluticasone /salmeterol) 2 puffs in the morning and 2 puffs at night. - Discontinue Spiriva  (tiotropium). - Use albuterol  inhaler as needed for acute symptoms.  Seasonal allergic rhinitis - Start Claritin (loratadine) or Allegra (fexofenadine) seasonally. - Use Flonase nasal spray seasonally.   Return to Care: Return in about 3 months (around 01/04/2024).  Verdon Gore, MD Pulmonary and Critical Care Medicine [facility] Office:[phone]  CC: Watt Mirza, MD

## 2023-10-04 NOTE — Patient Instructions (Addendum)
 It was a pleasure to see you today!  Please schedule follow up with myself in 3 months.  If my schedule is not open yet, we will contact you with a reminder closer to that time. Please call [phone] if you haven't heard from us  a month before, and always call us  sooner if issues or concerns arise. You can also send us  a message through MyChart, but but aware that this is not to be used for urgent issues and it may take up to 5-7 days to receive a reply. Please be aware that you will likely be able to view your results before I have a chance to respond to them. Please give us  5 business days to respond to any non-urgent results.    YOUR PLAN:  -ASTHMA COPD OVERLAP SYNDROME: COPD with asthma overlap means you have both chronic obstructive pulmonary disease and asthma, which can worsen with seasonal allergies. You should continue using Advair (2 puffs in the morning and 2 puffs at night) as it has been effective for you. We will discontinue Spiriva , and you should use your albuterol  inhaler as needed for acute symptoms. Additionally, start taking Claritin or Allegra seasonally and use Flonase nasal spray during allergy seasons.(Both of these are over the counter.) ok to take the hydrocodone  cough syrup sparingly at night time to help you sleep during a bronchitis flare.  Flonase - 1 spray on each side of your nose twice a day for first week, then 1 spray on each side.   Instructions for use: If you also use a saline nasal spray or rinse, use that first. Position the head with the chin slightly tucked. Use the right hand to spray into the left nostril and the right hand to spray into the left nostril.   Point the bottle away from the septum of your nose (cartilage that divides the two sides of your nose).  Hold the nostril closed on the opposite side from where you will spray Spray once and gently sniff to pull the medicine into the higher parts of your nose.  Don't sniff too hard as the medicine  will drain down the back of your throat instead. Repeat with a second spray on the same side if prescribed. Repeat on the other side of your nose.  Take the albuterol  rescue inhaler every 4 to 6 hours as needed for wheezing or shortness of breath. You can also take it 15 minutes before exercise or exertional activity. Side effects include heart racing or pounding, jitters or anxiety. If you have a history of an irregular heart rhythm, it can make this worse. Can also give some patients a hard time sleeping.

## 2023-10-26 DIAGNOSIS — H353130 Nonexudative age-related macular degeneration, bilateral, stage unspecified: Secondary | ICD-10-CM | POA: Diagnosis not present

## 2023-10-26 DIAGNOSIS — H35379 Puckering of macula, unspecified eye: Secondary | ICD-10-CM | POA: Diagnosis not present

## 2023-10-26 DIAGNOSIS — H353131 Nonexudative age-related macular degeneration, bilateral, early dry stage: Secondary | ICD-10-CM | POA: Diagnosis not present

## 2023-10-26 DIAGNOSIS — H353190 Nonexudative age-related macular degeneration, unspecified eye, stage unspecified: Secondary | ICD-10-CM | POA: Diagnosis not present

## 2023-10-26 DIAGNOSIS — H40053 Ocular hypertension, bilateral: Secondary | ICD-10-CM | POA: Diagnosis not present

## 2023-11-22 ENCOUNTER — Other Ambulatory Visit: Payer: Self-pay | Admitting: Family Medicine

## 2024-01-07 ENCOUNTER — Other Ambulatory Visit: Payer: Self-pay | Admitting: Family Medicine

## 2024-01-07 NOTE — Telephone Encounter (Signed)
 Last office visit 07/09/2023 for CPE.  Last Refill: Tussinex is not on current medication list.  No future appointments with PCP.  Refill?

## 2024-01-16 ENCOUNTER — Telehealth: Payer: Self-pay

## 2024-01-16 DIAGNOSIS — J431 Panlobular emphysema: Secondary | ICD-10-CM

## 2024-01-16 NOTE — Addendum Note (Signed)
 Addended by: WATT MIRZA on: 01/16/2024 12:18 PM   Modules accepted: Orders

## 2024-01-16 NOTE — Telephone Encounter (Signed)
 When she is is local, does she mean pulmonology in [place] or [place]?

## 2024-01-16 NOTE — Telephone Encounter (Signed)
 Copied from CRM [id number]. Topic: General - Other >> Jan 15, 2024  3:28 PM Frederich PARAS wrote: Reason for CRM: pt wants [place] dr. Tia nurse to give r her a call at her earliest convenience. Callback # is [phone]   Spoke with patient. Pt asking for PCP to recommend pulmonary doctor due to current one [place] to different county. Would rather somewhere local.

## 2024-01-23 ENCOUNTER — Encounter: Payer: Self-pay | Admitting: Internal Medicine

## 2024-01-23 ENCOUNTER — Ambulatory Visit: Admitting: Internal Medicine

## 2024-01-23 VITALS — BP 144/84 | HR 73 | Temp 97.6°F | Ht 61.0 in | Wt 145.8 lb

## 2024-01-23 DIAGNOSIS — Z87891 Personal history of nicotine dependence: Secondary | ICD-10-CM | POA: Diagnosis not present

## 2024-01-23 DIAGNOSIS — J4530 Mild persistent asthma, uncomplicated: Secondary | ICD-10-CM

## 2024-01-23 DIAGNOSIS — J301 Allergic rhinitis due to pollen: Secondary | ICD-10-CM | POA: Diagnosis not present

## 2024-01-23 DIAGNOSIS — J4540 Moderate persistent asthma, uncomplicated: Secondary | ICD-10-CM

## 2024-01-23 NOTE — Patient Instructions (Addendum)
 It was a pleasure to see you today!  Please schedule follow up as needed in our [facility] - Dr Tamea, Assaker, Dgyali, and Kasa all see patients in [place]. Please call [phone] if  issues or concerns arise. You can also send us  a message through MyChart, but but aware that this is not to be used for urgent issues and it may take up to 5-7 days to receive a reply. Please be aware that you will likely be able to view your results before I have a chance to respond to them. Please give us  5 business days to respond to any non-urgent results.   I am glad your breathing is doing well.  Please continue the Advair 2 puffs twice a day.  Gargle after use to prevent thrush.  You can continue the albuterol  inhaler as needed if you develop worsening symptoms of chest pain, shortness of breath, coughing, wheezing.  If after about 12 months you have not had any flares of your breathing I think it would be worth backing down on your Advair to 1 puff twice a day or even 2 puffs once daily.   I recommend continuing your Neti pot. You also use fluticasone  or mometasone nasal sprays to help with seasonal allergies ok to use an over the counter anti-histamine such as loratidine or cetirizine.

## 2024-01-23 NOTE — Progress Notes (Signed)
 Rebecca Hamilton    [id number]    08-Jul-1935  Primary Care Physician:Copland, Jacques, MD Date of Appointment: 01/23/2024 Established Patient Visit  Chief complaint:   Chief Complaint  Patient presents with   Asthma    Follow up asthma     HPI: Rebecca Hamilton is a 88 y.o. woman with asthma copd overlap syndrome, seasonal allergic rhinitis. History of tobacco use disorder quit in 1989.  Interval Updates: Here for 6 month follow up. She feels she is doing really well.  No albuterol  use. Faithful to the advair.  No interval episodes of bronchitis/exacerbations.   She does have some sinus congestion attributed to fall allergies. The summer was ok.   She takes an over the counter anti-histamine as well as nasal spray - doesn't know the name of the spray. She does use a neti pot.   She was prescribed some cough syrup pre-emptively but hasn't needed it yet.   No thrush or adverse effects.   Current Regimen: advair 115 2 puffs BID, albuterol  prn.  Asthma Triggers: seasonal allergies.  Exacerbations in the last year: none requiring prednisone  History of hospitalization or intubation: never Allergy Testing/rhinitis: yes, controlled on medication GERD: denies  ACT:  Asthma Control Test ACT Total Score  01/23/2024  1:13 PM 22   FeNO: Serum Eos/IgE:  Past Medical History:  Diagnosis Date   Colon polyps    COPD (chronic obstructive pulmonary disease) (HCC)    GERD (gastroesophageal reflux disease)    HTN (hypertension)    Hyperlipidemia    Osteopenia     Past Surgical History:  Procedure Laterality Date   CATARACT EXTRACTION W/PHACO Left 10/08/2018   Procedure: CATARACT EXTRACTION PHACO AND INTRAOCULAR LENS PLACEMENT (IOC) LEFT;  Surgeon: Jaye Fallow, MD;  Location: [facility];  Service: Ophthalmology;  Laterality: Left;   CATARACT EXTRACTION W/PHACO Right 11/05/2018   Procedure: CATARACT EXTRACTION PHACO AND INTRAOCULAR LENS PLACEMENT (IOC)  - right  00:34  15.3%  5.32;  Surgeon: Jaye Fallow, MD;  Location[facility];  Service: Ophthalmology;  Laterality: Right;   DENTAL SURGERY      Family History  Problem Relation Age of Onset   Heart disease Mother    Diabetes Father    Heart disease Father    Diabetes Brother    Emphysema Maternal Grandfather    Breast cancer Neg Hx    Lung cancer Neg Hx     Social History   Occupational History   Occupation: retired    Associate Professor: retired  Tobacco Use   Smoking status: Former    Current packs/day: 0.00    Average packs/day: 0.8 packs/day for 30.0 years (22.5 ttl pk-yrs)    Types: Cigarettes    Start date: 7    Quit date: 1988    Years since quitting: 37.8    Passive exposure: Never   Smokeless tobacco: Never   Tobacco comments:    quit 1988  Vaping Use   Vaping status: Never Used  Substance and Sexual Activity   Alcohol use: Yes    Alcohol/week: 2.0 standard drinks of alcohol    Types: 1 Glasses of wine, 1 Shots of liquor per week    Comment: daily   Drug use: No   Sexual activity: Not Currently     Physical Exam: Blood pressure (!) 144/84, pulse 73, temperature 97.6 F (36.4 C), temperature source Oral, height 5' 1 (1.549 m), weight 145 lb 12.8 oz (66.1  kg), SpO2 99%.  Gen:      No acute distress ENT:  no nasal polyps, mucus membranes moist Lungs:    No increased respiratory effort, symmetric chest wall excursion, clear to auscultation bilaterally, no wheezes or crackles CV:         Regular rate and rhythm; no murmurs, rubs, or gallops.  No pedal edema   Data Reviewed: Imaging: I have personally reviewed the chest xray feb 2025 - no acute process  PFTs:  Labs: Lab Results  Component Value Date   NA 131 (L) 07/02/2023   K 3.7 07/02/2023   CO2 27 07/02/2023   GLUCOSE 93 07/02/2023   BUN 10 07/02/2023   CREATININE 0.86 07/02/2023   CALCIUM  9.1 07/02/2023   GFR 61.39 07/02/2023   GFRNONAA 51.34 09/01/2009   Lab Results   Component Value Date   WBC 5.3 07/02/2023   HGB 13.2 07/02/2023   HCT 39.4 07/02/2023   MCV 90.7 07/02/2023   PLT 239.0 07/02/2023    Immunization status: Immunization History  Administered Date(s) Administered    sv, Bivalent, Protein Subunit Rsvpref,pf (Abrysvo) 12/16/2021   Fluad Quad(high Dose 65+) 01/07/2019, 12/04/2019, 01/03/2021, 01/05/2023   Fluad Trivalent(High Dose 65+) 12/26/2023   Fluzone Influenza virus vaccine,trivalent (IIV3), split virus 01/04/2017   Hepatitis A 06/26/1995, 12/26/1995   Hepatitis B 12/01/2005, 01/01/2006, 07/06/2006   Influenza Split 12/29/2011   Influenza Whole 03/14/2007   Influenza, Seasonal, Injecte, Preservative Fre 12/24/2017   Influenza,inj,Quad PF,6+ Mos 12/03/2013, 01/11/2015, 01/18/2016, 01/04/2017, 12/24/2017   Influenza-Unspecified 12/11/2012   Moderna Covid-19 Fall Seasonal Vaccine 39yrs & older 01/13/2022   Moderna Covid-19 Vaccine Bivalent Booster 28yrs & up 12/10/2020   PFIZER Comirnaty(Gray Top)Covid-19 Tri-Sucrose Vaccine 12/26/2022   PFIZER(Purple Top)SARS-COV-2 Vaccination 03/19/2019, 04/08/2019, 12/12/2019   Pfizer(Comirnaty)Fall Seasonal Vaccine 12 years and older 12/26/2022   Pneumococcal Conjugate-13 12/03/2013   Pneumococcal Polysaccharide-23 06/26/1995, 07/26/1999, 03/13/2005, 12/01/2005   Td 06/26/1995, 03/13/2005, 12/01/2005   Tdap 08/03/2020   Typhoid Live 12/01/2005   Zoster Recombinant(Shingrix) 12/27/2017, 03/25/2018   Zoster, Live 07/06/2006     Assessment:  Mild persistent asthma, controlled History of smoking Seasonal allergic rhinitis, controlled  Plan/Recommendations: I am glad your breathing is doing well.  Please continue the Advair 2 puffs twice a day.  Gargle after use to prevent thrush.  You can continue the albuterol  inhaler as needed if you develop worsening symptoms of chest pain, shortness of breath, coughing, wheezing.  If after about 12 months you have not had any flares of your breathing  I think it would be worth backing down on your Advair to 1 puff twice a day or even 2 puffs once daily.   I recommend continuing your Neti pot. You also use fluticasone  or mometasone nasal sprays to help with seasonal allergies ok to use an over the counter anti-histamine such as loratidine or cetirizine.      Return to Care: Return if symptoms worsen or fail to improve.   Verdon Gore, MD Pulmonary and Critical Care Medicine [facility] [phone]

## 2024-07-08 ENCOUNTER — Ambulatory Visit

## 2024-07-10 ENCOUNTER — Encounter: Admitting: Family Medicine
